# Patient Record
Sex: Female | Born: 1935 | Race: White | Hispanic: No | Smoking: Former smoker
Health system: Southern US, Community
[De-identification: ages and names within clinical notes are randomized; demographics above are authoritative.]

## PROBLEM LIST (undated history)

## (undated) DIAGNOSIS — M419 Scoliosis, unspecified: Secondary | ICD-10-CM

## (undated) DIAGNOSIS — G8929 Other chronic pain: Secondary | ICD-10-CM

## (undated) DIAGNOSIS — M549 Dorsalgia, unspecified: Secondary | ICD-10-CM

## (undated) DIAGNOSIS — M25569 Pain in unspecified knee: Secondary | ICD-10-CM

## (undated) DIAGNOSIS — M5430 Sciatica, unspecified side: Secondary | ICD-10-CM

## (undated) DIAGNOSIS — C4491 Basal cell carcinoma of skin, unspecified: Secondary | ICD-10-CM

## (undated) DIAGNOSIS — I341 Nonrheumatic mitral (valve) prolapse: Secondary | ICD-10-CM

## (undated) DIAGNOSIS — E039 Hypothyroidism, unspecified: Secondary | ICD-10-CM

## (undated) DIAGNOSIS — I10 Essential (primary) hypertension: Secondary | ICD-10-CM

## (undated) DIAGNOSIS — M5440 Lumbago with sciatica, unspecified side: Secondary | ICD-10-CM

## (undated) HISTORY — DX: Other chronic pain: G89.29

## (undated) HISTORY — PX: CATARACT EXTRACTION: SUR2

## (undated) HISTORY — DX: Essential (primary) hypertension: I10

## (undated) HISTORY — DX: Nonrheumatic mitral (valve) prolapse: I34.1

## (undated) HISTORY — DX: Scoliosis, unspecified: M41.9

## (undated) HISTORY — DX: Sciatica, unspecified side: M54.30

## (undated) HISTORY — DX: Lumbago with sciatica, unspecified side: M54.40

## (undated) HISTORY — PX: APPENDECTOMY: SHX54

## (undated) HISTORY — PX: BACK SURGERY: SHX140

## (undated) HISTORY — PX: JOINT REPLACEMENT: SHX530

## (undated) HISTORY — PX: ROTATOR CUFF REPAIR: SHX139

## (undated) HISTORY — DX: Dorsalgia, unspecified: M54.9

## (undated) HISTORY — PX: NECK SURGERY: SHX720

## (undated) HISTORY — DX: Hypothyroidism, unspecified: E03.9

## (undated) HISTORY — DX: Basal cell carcinoma of skin, unspecified: C44.91

## (undated) HISTORY — DX: Pain in unspecified knee: M25.569

---

## 2014-01-17 ENCOUNTER — Ambulatory Visit (INDEPENDENT_AMBULATORY_CARE_PROVIDER_SITE_OTHER): Payer: Medicare PPO | Admitting: Podiatry

## 2014-01-17 ENCOUNTER — Ambulatory Visit (INDEPENDENT_AMBULATORY_CARE_PROVIDER_SITE_OTHER): Payer: Medicare PPO

## 2014-01-17 ENCOUNTER — Other Ambulatory Visit: Payer: Self-pay | Admitting: *Deleted

## 2014-01-17 ENCOUNTER — Encounter: Payer: Self-pay | Admitting: Podiatry

## 2014-01-17 VITALS — BP 123/77 | HR 73 | Resp 16 | Ht 64.0 in | Wt 125.0 lb

## 2014-01-17 DIAGNOSIS — M8448XA Pathological fracture, other site, initial encounter for fracture: Secondary | ICD-10-CM

## 2014-01-17 DIAGNOSIS — M21619 Bunion of unspecified foot: Secondary | ICD-10-CM

## 2014-01-17 DIAGNOSIS — M2010 Hallux valgus (acquired), unspecified foot: Secondary | ICD-10-CM

## 2014-01-17 DIAGNOSIS — M2040 Other hammer toe(s) (acquired), unspecified foot: Secondary | ICD-10-CM

## 2014-01-17 MED ORDER — HYDROCODONE-ACETAMINOPHEN 10-325 MG PO TABS: 1.0000 | ORAL_TABLET | Freq: Three times a day (TID) | ORAL | Status: DC | PRN

## 2014-01-17 NOTE — Progress Notes (Signed)
   Subjective:    Patient ID: April Oliver, female    DOB: 1933/12/13, 80 y.o.   MRN: [id number]  HPI Comments: i have bunions on both feet, a callus on my rt foot, a hammer toe on my 2nd toe rt, and my rt heel hurts. Donnald Garre been having a lot of problems with my feet for a long time. The pain is getting worse. i dont do anything for my feet. It hurts to walk.  Foot Pain      Review of Systems     Objective:   Physical Exam        Assessment & Plan:

## 2014-01-17 NOTE — Progress Notes (Signed)
Subjective:     Patient ID: April Oliver, female   DOB: 1934-06-29, 80 y.o.   MRN: [id number]  Foot Pain   patient presents stating that she has been doing a lot of moving and that 3 days ago her heel started to hurt her intensely on the right heel. Also gives a history of bunions and hammertoe deformities  Review of Systems  All other systems reviewed and are negative.      Objective:   Physical Exam  Nursing note and vitals reviewed. Constitutional: She is oriented to person, place, and time.  Cardiovascular: Intact distal pulses.   Musculoskeletal: Normal range of motion.  Neurological: She is oriented to person, place, and time.  Skin: Skin is warm.   neurovascular status intact with severe forefoot derangement right and left foot with a overlapping second toe right over left and equinus condition diminished range of motion of the subtalar midtarsal joint and muscle strength. Patient is found to have severe discomfort not in the plantar heel but in the in tighter heel bone both medial and lateral side     Assessment:     I believe there is a stress fracture of the calcaneus and I explained this to the patient and also structural bunion and hammertoe deformity right over left    Plan:     Reviewed x-rays and did H&P with patient. And at this time placed in an air fracture walker to completely immobilize the heel and also wrote for Vicodin 05/31/2024 #30 to control the pain she is in at this time

## 2014-01-19 ENCOUNTER — Telehealth: Payer: Self-pay | Admitting: Podiatrist

## 2014-01-20 NOTE — Telephone Encounter (Signed)
Patient called the emergency pager because her boot is hurting --  She relates her foot feels too loose in the heel and she finds the boot to be cumbersome and heavy.  I explained that there is nothing I can do to adjust her boot being that it is a weekend and stated that she could call the office on Tuesday to see if they can adjust or pad the heel area for her.  She also states she has a wedding to attend in 2 weeks and she doesn't know how she is going to wear her boot with a formal outfit.

## 2014-01-21 ENCOUNTER — Ambulatory Visit: Payer: Self-pay | Admitting: Podiatry

## 2014-01-21 ENCOUNTER — Ambulatory Visit (INDEPENDENT_AMBULATORY_CARE_PROVIDER_SITE_OTHER): Payer: Medicare PPO | Admitting: Podiatry

## 2014-01-21 VITALS — BP 115/77 | HR 74 | Resp 16

## 2014-01-21 DIAGNOSIS — M8448XA Pathological fracture, other site, initial encounter for fracture: Secondary | ICD-10-CM

## 2014-01-21 DIAGNOSIS — M79609 Pain in unspecified limb: Secondary | ICD-10-CM

## 2014-01-21 NOTE — Progress Notes (Signed)
Subjective:     Patient ID: April Oliver, female   DOB: 19-Nov-1933, 80 y.o.   MRN: [id number]  HPI patient presents stating that her heel is still hurting but that she has trouble wearing the boots at all times   Review of Systems     Objective:   Physical Exam Neurovascular status intact with continued discomfort upon palpation to the medial lateral side   of the right calcaneus with a reduction of the discomfort from the intensivist of last week Assessment:     Improving from stress fracture right heel    Plan:     We dispensed an Darco shoe at this time which she may wear periodically but I still would prefer for the most part that she is in a air fracture walker

## 2014-01-28 ENCOUNTER — Telehealth: Payer: Self-pay | Admitting: *Deleted

## 2014-01-28 MED ORDER — HYDROCODONE-ACETAMINOPHEN 10-325 MG PO TABS: 1.0000 | ORAL_TABLET | Freq: Three times a day (TID) | ORAL | Status: DC | PRN

## 2014-01-28 NOTE — Telephone Encounter (Signed)
CALLED TO LET PATIENT KNOW THAT THE PRESCRIPTION WOULD BE AT THE FRONT DESK, HOWEVER HER HUSBAND COULD NOT HEAR ME ON THE PHONE, SHE WAS NOT AT HOME

## 2014-01-28 NOTE — Telephone Encounter (Signed)
Patient called and states that she still in a lot pain and also needs a refill on her prescription she only has two pills left

## 2014-01-30 ENCOUNTER — Telehealth: Payer: Self-pay | Admitting: *Deleted

## 2014-01-30 NOTE — Telephone Encounter (Signed)
Patient called and stated that her son in law had a stroke and wanted to make sure she can go and visit him.  Told patient that it was fine just said to stay off of if as much as she could

## 2014-02-07 ENCOUNTER — Ambulatory Visit: Payer: Medicare PPO | Admitting: Podiatry

## 2014-02-11 ENCOUNTER — Ambulatory Visit: Payer: Medicare PPO | Admitting: Podiatry

## 2014-02-11 ENCOUNTER — Ambulatory Visit (INDEPENDENT_AMBULATORY_CARE_PROVIDER_SITE_OTHER): Payer: Medicare PPO

## 2014-02-11 ENCOUNTER — Encounter: Payer: Self-pay | Admitting: Podiatry

## 2014-02-11 ENCOUNTER — Ambulatory Visit (INDEPENDENT_AMBULATORY_CARE_PROVIDER_SITE_OTHER): Payer: Medicare PPO | Admitting: Podiatry

## 2014-02-11 DIAGNOSIS — M8430XA Stress fracture, unspecified site, initial encounter for fracture: Secondary | ICD-10-CM

## 2014-02-11 DIAGNOSIS — M8448XA Pathological fracture, other site, initial encounter for fracture: Secondary | ICD-10-CM

## 2014-02-11 NOTE — Progress Notes (Signed)
Subjective:     Patient ID: April Oliver, female   DOB: [birth year], 80 y.o.   MRN: [id number]  HPI patient states that it feels okay and she continues to wear her boot   Review of Systems     Objective:   Physical Exam Neurovascular status unchanged with patient well oriented x3 and is found to have pain that has reduced quite dramatically in the posterior heel in the bulk of the heel bone itself    Assessment:     Improving stress fracture right    Plan:     A daily and reviewed x-rays and allow patient to gradually reduce the boot usage over the next couple weeks and begin shoe gear usage reappoint as needed

## 2014-02-26 ENCOUNTER — Telehealth: Payer: Self-pay | Admitting: *Deleted

## 2014-02-26 NOTE — Telephone Encounter (Signed)
Pt wanted to know what type of shoes dr April Oliver recommends. I told pt new balance and told her the stores were in [place] and [place]. She also said she may need another brace and i told pt no appt was needed, she can come in the office and purchase one. Pt understood.

## 2014-03-11 DIAGNOSIS — E039 Hypothyroidism, unspecified: Secondary | ICD-10-CM | POA: Insufficient documentation

## 2014-03-11 DIAGNOSIS — F419 Anxiety disorder, unspecified: Secondary | ICD-10-CM | POA: Insufficient documentation

## 2014-03-11 DIAGNOSIS — I10 Essential (primary) hypertension: Secondary | ICD-10-CM | POA: Insufficient documentation

## 2014-03-11 DIAGNOSIS — M5430 Sciatica, unspecified side: Secondary | ICD-10-CM

## 2014-03-11 HISTORY — DX: Sciatica, unspecified side: M54.30

## 2014-03-17 DIAGNOSIS — T849XXA Unspecified complication of internal orthopedic prosthetic device, implant and graft, initial encounter: Secondary | ICD-10-CM | POA: Insufficient documentation

## 2014-05-14 DIAGNOSIS — M5440 Lumbago with sciatica, unspecified side: Secondary | ICD-10-CM

## 2014-05-14 HISTORY — DX: Lumbago with sciatica, unspecified side: M54.40

## 2014-05-21 ENCOUNTER — Ambulatory Visit: Payer: Self-pay | Admitting: Pain Medicine

## 2014-05-26 ENCOUNTER — Other Ambulatory Visit: Payer: Self-pay | Admitting: Pain Medicine

## 2014-05-26 LAB — HEPATIC FUNCTION PANEL A (ARMC)
ALBUMIN: 3.8 g/dL (ref 3.4–5.0)
ALK PHOS: 59 U/L
AST: 22 U/L (ref 15–37)
BILIRUBIN DIRECT: 0.2 mg/dL (ref 0.00–0.20)
Bilirubin,Total: 0.8 mg/dL (ref 0.2–1.0)
SGPT (ALT): 25 U/L
Total Protein: 6.6 g/dL (ref 6.4–8.2)

## 2014-05-26 LAB — BASIC METABOLIC PANEL
ANION GAP: 5 — AB (ref 7–16)
BUN: 15 mg/dL (ref 7–18)
CO2: 30 mmol/L (ref 21–32)
Calcium, Total: 8.6 mg/dL (ref 8.5–10.1)
Chloride: 104 mmol/L (ref 98–107)
Creatinine: 1.07 mg/dL (ref 0.60–1.30)
GFR CALC NON AF AMER: 53 — AB
Glucose: 85 mg/dL (ref 65–99)
OSMOLALITY: 278 (ref 275–301)
Potassium: 3.8 mmol/L (ref 3.5–5.1)
Sodium: 139 mmol/L (ref 136–145)

## 2014-05-26 LAB — PROTIME-INR
INR: 1
Prothrombin Time: 13.1 secs (ref 11.5–14.7)

## 2014-05-26 LAB — SEDIMENTATION RATE: Erythrocyte Sed Rate: 4 mm/hr (ref 0–30)

## 2014-05-26 LAB — MAGNESIUM: Magnesium: 1.5 mg/dL — ABNORMAL LOW

## 2014-05-27 ENCOUNTER — Emergency Department: Payer: Self-pay | Admitting: Internal Medicine

## 2014-06-06 ENCOUNTER — Ambulatory Visit (INDEPENDENT_AMBULATORY_CARE_PROVIDER_SITE_OTHER): Payer: Medicare PPO | Admitting: Podiatry

## 2014-06-06 ENCOUNTER — Ambulatory Visit (INDEPENDENT_AMBULATORY_CARE_PROVIDER_SITE_OTHER): Payer: Medicare PPO

## 2014-06-06 VITALS — BP 149/86 | HR 75 | Resp 16

## 2014-06-06 DIAGNOSIS — M722 Plantar fascial fibromatosis: Secondary | ICD-10-CM

## 2014-06-06 DIAGNOSIS — M2041 Other hammer toe(s) (acquired), right foot: Secondary | ICD-10-CM

## 2014-06-06 MED ORDER — TRIAMCINOLONE ACETONIDE 10 MG/ML IJ SUSP: 10.0000 mg | Freq: Once | INTRAMUSCULAR | Status: AC
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
## 2014-06-10 DIAGNOSIS — M25369 Other instability, unspecified knee: Secondary | ICD-10-CM | POA: Insufficient documentation

## 2014-06-10 DIAGNOSIS — M25569 Pain in unspecified knee: Secondary | ICD-10-CM

## 2014-06-10 DIAGNOSIS — Z9889 Other specified postprocedural states: Secondary | ICD-10-CM | POA: Insufficient documentation

## 2014-06-10 HISTORY — DX: Pain in unspecified knee: M25.569

## 2014-06-23 DIAGNOSIS — G8929 Other chronic pain: Secondary | ICD-10-CM | POA: Insufficient documentation

## 2014-06-23 DIAGNOSIS — M549 Dorsalgia, unspecified: Secondary | ICD-10-CM

## 2014-07-01 ENCOUNTER — Ambulatory Visit: Payer: Self-pay | Admitting: Podiatry

## 2014-07-01 ENCOUNTER — Ambulatory Visit: Payer: Medicare PPO | Admitting: Podiatry

## 2014-07-11 ENCOUNTER — Ambulatory Visit (INDEPENDENT_AMBULATORY_CARE_PROVIDER_SITE_OTHER): Payer: Medicare PPO | Admitting: Podiatry

## 2014-07-11 VITALS — BP 133/86 | HR 67 | Resp 16

## 2014-07-11 DIAGNOSIS — M722 Plantar fascial fibromatosis: Secondary | ICD-10-CM

## 2014-07-11 MED ORDER — TRIAMCINOLONE ACETONIDE 10 MG/ML IJ SUSP
10.0000 mg | Freq: Once | INTRAMUSCULAR | Status: AC
  Administered 2014-07-11: 10 mg
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
## 2014-07-31 DIAGNOSIS — R768 Other specified abnormal immunological findings in serum: Secondary | ICD-10-CM | POA: Insufficient documentation

## 2014-08-01 ENCOUNTER — Emergency Department: Payer: Self-pay | Admitting: Emergency Medicine

## 2014-08-01 LAB — CBC
HCT: 30.6 % — AB (ref 35.0–47.0)
HGB: 10.4 g/dL — ABNORMAL LOW (ref 12.0–16.0)
MCH: 33 pg (ref 26.0–34.0)
MCHC: 34 g/dL (ref 32.0–36.0)
MCV: 97 fL (ref 80–100)
Platelet: 269 10*3/uL (ref 150–440)
RBC: 3.16 10*6/uL — AB (ref 3.80–5.20)
RDW: 13.4 % (ref 11.5–14.5)
WBC: 5.7 10*3/uL (ref 3.6–11.0)

## 2014-08-01 LAB — COMPREHENSIVE METABOLIC PANEL
ANION GAP: 6 — AB (ref 7–16)
AST: 22 U/L (ref 15–37)
Albumin: 3.2 g/dL — ABNORMAL LOW (ref 3.4–5.0)
Alkaline Phosphatase: 55 U/L
BUN: 8 mg/dL (ref 7–18)
Bilirubin,Total: 0.6 mg/dL (ref 0.2–1.0)
Calcium, Total: 8.3 mg/dL — ABNORMAL LOW (ref 8.5–10.1)
Chloride: 100 mmol/L (ref 98–107)
Co2: 29 mmol/L (ref 21–32)
Creatinine: 0.89 mg/dL (ref 0.60–1.30)
GLUCOSE: 78 mg/dL (ref 65–99)
OSMOLALITY: 267 (ref 275–301)
POTASSIUM: 3.8 mmol/L (ref 3.5–5.1)
SGPT (ALT): 26 U/L
Sodium: 135 mmol/L — ABNORMAL LOW (ref 136–145)
Total Protein: 5.7 g/dL — ABNORMAL LOW (ref 6.4–8.2)

## 2014-08-05 DIAGNOSIS — Z9660 Presence of unspecified orthopedic joint implant: Secondary | ICD-10-CM | POA: Insufficient documentation

## 2014-08-06 LAB — CULTURE, BLOOD (SINGLE)

## 2014-08-08 ENCOUNTER — Ambulatory Visit: Payer: Medicare PPO | Admitting: Podiatry

## 2014-09-03 DIAGNOSIS — M6281 Muscle weakness (generalized): Secondary | ICD-10-CM | POA: Diagnosis not present

## 2014-09-05 DIAGNOSIS — M6281 Muscle weakness (generalized): Secondary | ICD-10-CM | POA: Diagnosis not present

## 2014-09-08 DIAGNOSIS — M6281 Muscle weakness (generalized): Secondary | ICD-10-CM | POA: Diagnosis not present

## 2014-09-11 ENCOUNTER — Telehealth: Payer: Self-pay | Admitting: *Deleted
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
## 2014-09-22 DIAGNOSIS — Z1239 Encounter for other screening for malignant neoplasm of breast: Secondary | ICD-10-CM | POA: Diagnosis not present

## 2014-09-22 DIAGNOSIS — M8580 Other specified disorders of bone density and structure, unspecified site: Secondary | ICD-10-CM | POA: Diagnosis not present

## 2014-09-22 DIAGNOSIS — M25562 Pain in left knee: Secondary | ICD-10-CM | POA: Diagnosis not present

## 2014-09-23 DIAGNOSIS — M6281 Muscle weakness (generalized): Secondary | ICD-10-CM | POA: Diagnosis not present

## 2014-09-24 DIAGNOSIS — M6281 Muscle weakness (generalized): Secondary | ICD-10-CM | POA: Diagnosis not present

## 2014-10-07 DIAGNOSIS — Z96652 Presence of left artificial knee joint: Secondary | ICD-10-CM | POA: Diagnosis not present

## 2014-10-07 DIAGNOSIS — G8929 Other chronic pain: Secondary | ICD-10-CM | POA: Diagnosis not present

## 2014-10-07 DIAGNOSIS — M25562 Pain in left knee: Secondary | ICD-10-CM | POA: Diagnosis not present

## 2014-10-24 ENCOUNTER — Encounter: Payer: Self-pay | Admitting: Podiatry

## 2014-10-24 ENCOUNTER — Ambulatory Visit (INDEPENDENT_AMBULATORY_CARE_PROVIDER_SITE_OTHER): Payer: Commercial Managed Care - HMO | Admitting: Podiatry

## 2014-10-24 VITALS — BP 133/83 | HR 79 | Resp 16

## 2014-10-24 DIAGNOSIS — M779 Enthesopathy, unspecified: Secondary | ICD-10-CM | POA: Diagnosis not present

## 2014-10-24 MED ORDER — TRIAMCINOLONE ACETONIDE 10 MG/ML IJ SUSP
10.0000 mg | Freq: Once | INTRAMUSCULAR | Status: AC
  Administered 2014-10-24: 10 mg
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
## 2014-10-27 DIAGNOSIS — M542 Cervicalgia: Secondary | ICD-10-CM | POA: Diagnosis not present

## 2014-10-27 DIAGNOSIS — E039 Hypothyroidism, unspecified: Secondary | ICD-10-CM | POA: Diagnosis not present

## 2014-10-27 DIAGNOSIS — I10 Essential (primary) hypertension: Secondary | ICD-10-CM | POA: Diagnosis not present

## 2014-10-27 DIAGNOSIS — J45909 Unspecified asthma, uncomplicated: Secondary | ICD-10-CM | POA: Insufficient documentation

## 2014-10-29 ENCOUNTER — Ambulatory Visit: Payer: Self-pay | Admitting: Internal Medicine

## 2014-10-29 DIAGNOSIS — Z1231 Encounter for screening mammogram for malignant neoplasm of breast: Secondary | ICD-10-CM | POA: Diagnosis not present

## 2014-11-03 ENCOUNTER — Ambulatory Visit: Payer: Self-pay | Admitting: Pain Medicine

## 2014-11-03 DIAGNOSIS — Z96653 Presence of artificial knee joint, bilateral: Secondary | ICD-10-CM | POA: Diagnosis not present

## 2014-11-03 DIAGNOSIS — M25561 Pain in right knee: Secondary | ICD-10-CM | POA: Diagnosis not present

## 2014-11-03 DIAGNOSIS — M47812 Spondylosis without myelopathy or radiculopathy, cervical region: Secondary | ICD-10-CM | POA: Diagnosis not present

## 2014-11-03 DIAGNOSIS — M25562 Pain in left knee: Secondary | ICD-10-CM | POA: Diagnosis not present

## 2014-11-03 DIAGNOSIS — M170 Bilateral primary osteoarthritis of knee: Secondary | ICD-10-CM | POA: Diagnosis not present

## 2014-11-03 DIAGNOSIS — F17200 Nicotine dependence, unspecified, uncomplicated: Secondary | ICD-10-CM | POA: Diagnosis not present

## 2014-11-03 DIAGNOSIS — M47817 Spondylosis without myelopathy or radiculopathy, lumbosacral region: Secondary | ICD-10-CM | POA: Diagnosis not present

## 2014-11-03 DIAGNOSIS — M419 Scoliosis, unspecified: Secondary | ICD-10-CM | POA: Diagnosis not present

## 2014-11-03 DIAGNOSIS — M542 Cervicalgia: Secondary | ICD-10-CM | POA: Diagnosis not present

## 2014-11-03 DIAGNOSIS — E039 Hypothyroidism, unspecified: Secondary | ICD-10-CM | POA: Diagnosis not present

## 2014-11-03 DIAGNOSIS — M546 Pain in thoracic spine: Secondary | ICD-10-CM | POA: Diagnosis not present

## 2014-11-03 DIAGNOSIS — G894 Chronic pain syndrome: Secondary | ICD-10-CM | POA: Diagnosis not present

## 2014-11-03 DIAGNOSIS — Z79899 Other long term (current) drug therapy: Secondary | ICD-10-CM | POA: Diagnosis not present

## 2014-11-03 DIAGNOSIS — M461 Sacroiliitis, not elsewhere classified: Secondary | ICD-10-CM | POA: Diagnosis not present

## 2014-11-03 DIAGNOSIS — I10 Essential (primary) hypertension: Secondary | ICD-10-CM | POA: Diagnosis not present

## 2014-11-24 ENCOUNTER — Ambulatory Visit: Payer: Self-pay | Admitting: Pain Medicine

## 2014-11-24 DIAGNOSIS — N183 Chronic kidney disease, stage 3 (moderate): Secondary | ICD-10-CM | POA: Diagnosis not present

## 2014-11-24 DIAGNOSIS — Z01818 Encounter for other preprocedural examination: Secondary | ICD-10-CM | POA: Diagnosis not present

## 2014-11-24 DIAGNOSIS — M545 Low back pain: Secondary | ICD-10-CM | POA: Diagnosis not present

## 2014-11-24 DIAGNOSIS — S3992XA Unspecified injury of lower back, initial encounter: Secondary | ICD-10-CM | POA: Diagnosis not present

## 2014-11-24 DIAGNOSIS — M47896 Other spondylosis, lumbar region: Secondary | ICD-10-CM | POA: Diagnosis not present

## 2014-11-24 DIAGNOSIS — M5134 Other intervertebral disc degeneration, thoracic region: Secondary | ICD-10-CM | POA: Diagnosis not present

## 2014-11-24 DIAGNOSIS — E039 Hypothyroidism, unspecified: Secondary | ICD-10-CM | POA: Diagnosis not present

## 2014-11-24 DIAGNOSIS — R300 Dysuria: Secondary | ICD-10-CM | POA: Diagnosis not present

## 2014-11-24 DIAGNOSIS — M2578 Osteophyte, vertebrae: Secondary | ICD-10-CM | POA: Diagnosis not present

## 2014-11-24 DIAGNOSIS — N390 Urinary tract infection, site not specified: Secondary | ICD-10-CM | POA: Diagnosis not present

## 2014-11-24 DIAGNOSIS — Z981 Arthrodesis status: Secondary | ICD-10-CM | POA: Diagnosis not present

## 2014-11-24 DIAGNOSIS — M47894 Other spondylosis, thoracic region: Secondary | ICD-10-CM | POA: Diagnosis not present

## 2014-11-24 DIAGNOSIS — I10 Essential (primary) hypertension: Secondary | ICD-10-CM | POA: Diagnosis not present

## 2014-12-01 ENCOUNTER — Ambulatory Visit
Admit: 2014-12-01 | Disposition: A | Discharge status: Home / Self Care | Payer: Self-pay | Attending: Pain Medicine | Admitting: Pain Medicine

## 2014-12-01 DIAGNOSIS — M25562 Pain in left knee: Secondary | ICD-10-CM | POA: Diagnosis not present

## 2014-12-01 DIAGNOSIS — Z96652 Presence of left artificial knee joint: Secondary | ICD-10-CM | POA: Diagnosis not present

## 2014-12-01 DIAGNOSIS — M170 Bilateral primary osteoarthritis of knee: Secondary | ICD-10-CM | POA: Diagnosis not present

## 2014-12-08 DIAGNOSIS — M8580 Other specified disorders of bone density and structure, unspecified site: Secondary | ICD-10-CM | POA: Diagnosis not present

## 2014-12-12 DIAGNOSIS — F419 Anxiety disorder, unspecified: Secondary | ICD-10-CM | POA: Diagnosis not present

## 2014-12-12 DIAGNOSIS — M810 Age-related osteoporosis without current pathological fracture: Secondary | ICD-10-CM | POA: Diagnosis not present

## 2014-12-15 ENCOUNTER — Ambulatory Visit
Admit: 2014-12-15 | Disposition: A | Discharge status: Home / Self Care | Payer: Self-pay | Attending: Pain Medicine | Admitting: Pain Medicine

## 2014-12-15 DIAGNOSIS — Z9889 Other specified postprocedural states: Secondary | ICD-10-CM | POA: Diagnosis not present

## 2014-12-15 DIAGNOSIS — M461 Sacroiliitis, not elsewhere classified: Secondary | ICD-10-CM | POA: Diagnosis not present

## 2014-12-15 DIAGNOSIS — M170 Bilateral primary osteoarthritis of knee: Secondary | ICD-10-CM | POA: Diagnosis not present

## 2014-12-15 DIAGNOSIS — M545 Low back pain: Secondary | ICD-10-CM | POA: Diagnosis not present

## 2014-12-15 DIAGNOSIS — M47817 Spondylosis without myelopathy or radiculopathy, lumbosacral region: Secondary | ICD-10-CM | POA: Diagnosis not present

## 2014-12-15 DIAGNOSIS — M25562 Pain in left knee: Secondary | ICD-10-CM | POA: Diagnosis not present

## 2014-12-15 DIAGNOSIS — M5416 Radiculopathy, lumbar region: Secondary | ICD-10-CM | POA: Diagnosis not present

## 2014-12-15 DIAGNOSIS — Z96652 Presence of left artificial knee joint: Secondary | ICD-10-CM | POA: Diagnosis not present

## 2014-12-24 DIAGNOSIS — M549 Dorsalgia, unspecified: Secondary | ICD-10-CM | POA: Diagnosis not present

## 2014-12-24 DIAGNOSIS — Z79891 Long term (current) use of opiate analgesic: Secondary | ICD-10-CM | POA: Diagnosis not present

## 2014-12-24 DIAGNOSIS — M79606 Pain in leg, unspecified: Secondary | ICD-10-CM | POA: Diagnosis not present

## 2014-12-24 DIAGNOSIS — M542 Cervicalgia: Secondary | ICD-10-CM | POA: Diagnosis not present

## 2014-12-24 DIAGNOSIS — G8929 Other chronic pain: Secondary | ICD-10-CM | POA: Diagnosis not present

## 2015-01-13 DIAGNOSIS — F4542 Pain disorder with related psychological factors: Secondary | ICD-10-CM | POA: Diagnosis not present

## 2015-01-15 DIAGNOSIS — M549 Dorsalgia, unspecified: Secondary | ICD-10-CM | POA: Diagnosis not present

## 2015-01-15 DIAGNOSIS — M25569 Pain in unspecified knee: Secondary | ICD-10-CM | POA: Diagnosis not present

## 2015-01-15 DIAGNOSIS — M5136 Other intervertebral disc degeneration, lumbar region: Secondary | ICD-10-CM | POA: Diagnosis not present

## 2015-01-15 DIAGNOSIS — M5416 Radiculopathy, lumbar region: Secondary | ICD-10-CM | POA: Diagnosis not present

## 2015-01-15 DIAGNOSIS — G8929 Other chronic pain: Secondary | ICD-10-CM | POA: Diagnosis not present

## 2015-01-15 DIAGNOSIS — M47816 Spondylosis without myelopathy or radiculopathy, lumbar region: Secondary | ICD-10-CM | POA: Diagnosis not present

## 2015-01-15 DIAGNOSIS — M179 Osteoarthritis of knee, unspecified: Secondary | ICD-10-CM | POA: Diagnosis not present

## 2015-01-15 DIAGNOSIS — M542 Cervicalgia: Secondary | ICD-10-CM | POA: Diagnosis not present

## 2015-01-29 DIAGNOSIS — M25569 Pain in unspecified knee: Secondary | ICD-10-CM | POA: Diagnosis not present

## 2015-01-29 DIAGNOSIS — M47816 Spondylosis without myelopathy or radiculopathy, lumbar region: Secondary | ICD-10-CM | POA: Diagnosis not present

## 2015-01-29 DIAGNOSIS — M5136 Other intervertebral disc degeneration, lumbar region: Secondary | ICD-10-CM | POA: Diagnosis not present

## 2015-01-29 DIAGNOSIS — M542 Cervicalgia: Secondary | ICD-10-CM | POA: Diagnosis not present

## 2015-01-29 DIAGNOSIS — M5416 Radiculopathy, lumbar region: Secondary | ICD-10-CM | POA: Diagnosis not present

## 2015-01-29 DIAGNOSIS — M549 Dorsalgia, unspecified: Secondary | ICD-10-CM | POA: Diagnosis not present

## 2015-01-29 DIAGNOSIS — M179 Osteoarthritis of knee, unspecified: Secondary | ICD-10-CM | POA: Diagnosis not present

## 2015-01-29 DIAGNOSIS — G8929 Other chronic pain: Secondary | ICD-10-CM | POA: Diagnosis not present

## 2015-02-24 DIAGNOSIS — M488X6 Other specified spondylopathies, lumbar region: Secondary | ICD-10-CM | POA: Diagnosis not present

## 2015-02-24 DIAGNOSIS — M47816 Spondylosis without myelopathy or radiculopathy, lumbar region: Secondary | ICD-10-CM | POA: Diagnosis not present

## 2015-02-24 DIAGNOSIS — M5387 Other specified dorsopathies, lumbosacral region: Secondary | ICD-10-CM | POA: Diagnosis not present

## 2015-02-24 DIAGNOSIS — M545 Low back pain: Secondary | ICD-10-CM | POA: Diagnosis not present

## 2015-03-10 DIAGNOSIS — F419 Anxiety disorder, unspecified: Secondary | ICD-10-CM | POA: Diagnosis not present

## 2015-03-10 DIAGNOSIS — M5442 Lumbago with sciatica, left side: Secondary | ICD-10-CM | POA: Diagnosis not present

## 2015-03-10 DIAGNOSIS — G8929 Other chronic pain: Secondary | ICD-10-CM | POA: Diagnosis not present

## 2015-03-10 DIAGNOSIS — M25561 Pain in right knee: Secondary | ICD-10-CM | POA: Diagnosis not present

## 2015-03-10 DIAGNOSIS — M1712 Unilateral primary osteoarthritis, left knee: Secondary | ICD-10-CM | POA: Diagnosis not present

## 2015-03-10 DIAGNOSIS — M25562 Pain in left knee: Secondary | ICD-10-CM | POA: Diagnosis not present

## 2015-03-10 DIAGNOSIS — Z96652 Presence of left artificial knee joint: Secondary | ICD-10-CM | POA: Diagnosis not present

## 2015-03-12 DIAGNOSIS — M25569 Pain in unspecified knee: Secondary | ICD-10-CM | POA: Diagnosis not present

## 2015-03-12 DIAGNOSIS — F1120 Opioid dependence, uncomplicated: Secondary | ICD-10-CM | POA: Diagnosis not present

## 2015-03-12 DIAGNOSIS — G8929 Other chronic pain: Secondary | ICD-10-CM | POA: Diagnosis not present

## 2015-03-12 DIAGNOSIS — M5387 Other specified dorsopathies, lumbosacral region: Secondary | ICD-10-CM | POA: Diagnosis not present

## 2015-03-12 DIAGNOSIS — M545 Low back pain: Secondary | ICD-10-CM | POA: Diagnosis not present

## 2015-03-12 DIAGNOSIS — M549 Dorsalgia, unspecified: Secondary | ICD-10-CM | POA: Diagnosis not present

## 2015-03-12 DIAGNOSIS — Z79891 Long term (current) use of opiate analgesic: Secondary | ICD-10-CM | POA: Diagnosis not present

## 2015-03-12 DIAGNOSIS — M488X6 Other specified spondylopathies, lumbar region: Secondary | ICD-10-CM | POA: Diagnosis not present

## 2015-03-12 DIAGNOSIS — M47816 Spondylosis without myelopathy or radiculopathy, lumbar region: Secondary | ICD-10-CM | POA: Diagnosis not present

## 2015-03-19 DIAGNOSIS — F329 Major depressive disorder, single episode, unspecified: Secondary | ICD-10-CM | POA: Diagnosis not present

## 2015-03-19 DIAGNOSIS — M810 Age-related osteoporosis without current pathological fracture: Secondary | ICD-10-CM | POA: Diagnosis not present

## 2015-03-19 DIAGNOSIS — E039 Hypothyroidism, unspecified: Secondary | ICD-10-CM | POA: Diagnosis not present

## 2015-03-21 DIAGNOSIS — M810 Age-related osteoporosis without current pathological fracture: Secondary | ICD-10-CM | POA: Insufficient documentation

## 2015-03-21 DIAGNOSIS — F32A Depression, unspecified: Secondary | ICD-10-CM | POA: Insufficient documentation

## 2015-03-21 DIAGNOSIS — F329 Major depressive disorder, single episode, unspecified: Secondary | ICD-10-CM | POA: Insufficient documentation

## 2015-03-30 DIAGNOSIS — M25561 Pain in right knee: Secondary | ICD-10-CM | POA: Diagnosis not present

## 2015-03-30 DIAGNOSIS — G894 Chronic pain syndrome: Secondary | ICD-10-CM | POA: Insufficient documentation

## 2015-04-08 ENCOUNTER — Telehealth: Payer: Self-pay | Admitting: *Deleted

## 2015-04-08 NOTE — Telephone Encounter (Signed)
Entered in error

## 2015-04-08 NOTE — Telephone Encounter (Signed)
Pt asked what athletic shoe would be good for her plantar fasciitis, and hammer toes and bunions.  I told pt generally the [employer] Balance higher numbers were good shoes for OTC shoes with wide and deep toe boxes for forefoot deformities and a well place arch cookie for some arch support.  Pt states she also has knee pain; I encouraged pt to make an appt for prescriptive orthotics to move help with her arches and knees.  Pt agreed and was transferred to schedulers.

## 2015-04-09 DIAGNOSIS — R300 Dysuria: Secondary | ICD-10-CM | POA: Diagnosis not present

## 2015-04-09 DIAGNOSIS — M722 Plantar fascial fibromatosis: Secondary | ICD-10-CM | POA: Diagnosis not present

## 2015-04-16 ENCOUNTER — Ambulatory Visit: Payer: Commercial Managed Care - HMO | Admitting: Podiatry

## 2015-04-16 ENCOUNTER — Ambulatory Visit (INDEPENDENT_AMBULATORY_CARE_PROVIDER_SITE_OTHER): Payer: Commercial Managed Care - HMO | Admitting: Podiatry

## 2015-04-16 ENCOUNTER — Encounter: Payer: Self-pay | Admitting: Podiatry

## 2015-04-16 ENCOUNTER — Ambulatory Visit (INDEPENDENT_AMBULATORY_CARE_PROVIDER_SITE_OTHER): Payer: Commercial Managed Care - HMO

## 2015-04-16 VITALS — BP 66/44 | HR 55 | Resp 18

## 2015-04-16 DIAGNOSIS — M2010 Hallux valgus (acquired), unspecified foot: Secondary | ICD-10-CM

## 2015-04-16 DIAGNOSIS — B351 Tinea unguium: Secondary | ICD-10-CM | POA: Diagnosis not present

## 2015-04-16 DIAGNOSIS — M722 Plantar fascial fibromatosis: Secondary | ICD-10-CM

## 2015-04-16 DIAGNOSIS — M779 Enthesopathy, unspecified: Secondary | ICD-10-CM | POA: Insufficient documentation

## 2015-04-16 DIAGNOSIS — M2040 Other hammer toe(s) (acquired), unspecified foot: Secondary | ICD-10-CM | POA: Diagnosis not present

## 2015-04-16 NOTE — Patient Instructions (Signed)
Peroneal Tendinitis with Rehab Tendonitis is inflammation of a tendon. Inflammation of the tendons on the back of the outer ankle (peroneal tendons) is known as peroneal tendonitis. The peroneal tendons are responsible for connecting the muscles that allow you to stand on your tiptoes to the bones of the ankle. For this reason, peroneal tendonitis often causes pain when trying to complete such motions. Peroneal tendonitis often involves a tear (strain) of the peroneal tendons. Strains are classified into three categories. Grade 1 strains cause pain, but the tendon is not lengthened. Grade 2 strains include a lengthened ligament, due to the ligament being stretched or partially ruptured. With grade 2 strains there is still function, although function may be decreased. Grade 3 strains involve a complete tear of the tendon or muscle, and function is usually impaired. SYMPTOMS   Pain, tenderness, swelling, warmth, or redness over the back of the outer side of the ankle, the outer part of the mid-foot, or the bottom of the arch.  Pain that gets worse with ankle motion (especially when pushing off or pushing down with the front of the foot), or when standing on the ball of the foot or pushing the foot outward.  Crackling sound (crepitation) when the tendon is moved or touched. CAUSES  Peroneal tendinitis occurs when injury to the peroneal tendons causes the body to respond with inflammation. Common causes of injury include:  An overuse injury, in which the groove behind the outer ankle (where the tendon is located) causes wear on the tendon.  A sudden stress placed on the tendon, such as from an increase in the intensity, frequency, or duration of training.  Direct hit (trauma) to the tendon.  Return to activity too soon after a previous ankle injury. RISK INCREASES WITH:  Sports that require sudden, repetitive pushing off of the foot, such as jumping or quick starts.  Kicking and running sports,  especially running down hills or long distances.  Poor strength and flexibility.  Previous injury to the foot, ankle, or leg. PREVENTION  Warm up and stretch properly before activity.  Allow for adequate recovery between workouts.  Maintain physical fitness:  Strength, flexibility, and endurance.  Cardiovascular fitness.  Complete rehabilitation after previous injury. PROGNOSIS  If treated properly, peroneal tendonitis usually heals within 6 weeks.  RELATED COMPLICATIONS  Longer healing time, if not properly treated or if not given enough time to heal.  Recurring symptoms if activity is resumed too soon, with overuse, or when using poor technique.  If untreated, tendinitis may result in tendon rupture, requiring surgery. TREATMENT  Treatment first involves the use of ice and medicine to reduce pain and inflammation. The use of strengthening and stretching exercises may help reduce pain with activity. These exercises may be performed at home or with a therapist. Sometimes, the foot and ankle will be restrained for 10 to 14 days to promote healing. Your caregiver may advise that you place a heel lift in your shoes to reduce the stress placed on the tendon. If nonsurgical treatment is unsuccessful, surgery to remove the inflamed tendon lining (sheath) may be advised.  MEDICATION   If pain medicine is needed, nonsteroidal anti-inflammatory medicines (aspirin and ibuprofen), or other minor pain relievers (acetaminophen), are often advised.  Do not take pain medicine for 7 days before surgery.  Prescription pain relievers may be given, if your caregiver thinks they are needed. Use only as directed and only as much as you need. HEAT AND COLD  Cold treatment (icing) should   be applied for 10 to 15 minutes every 2 to 3 hours for inflammation and pain, and immediately after activity that aggravates your symptoms. Use ice packs or an ice massage.  Heat treatment may be used before  performing stretching and strengthening activities prescribed by your caregiver, physical therapist, or athletic trainer. Use a heat pack or a warm water soak. SEEK MEDICAL CARE IF:  Symptoms get worse or do not improve in 2 to 4 weeks, despite treatment.  New, unexplained symptoms develop. (Drugs used in treatment may produce side effects.) EXERCISES RANGE OF MOTION (ROM) AND STRETCHING EXERCISES - Peroneal Tendinitis These exercises may help you when beginning to rehabilitate your injury. Your symptoms may resolve with or without further involvement from your physician, physical therapist or athletic trainer. While completing these exercises, remember:   Restoring tissue flexibility helps normal motion to return to the joints. This allows healthier, less painful movement and activity.  An effective stretch should be held for at least 30 seconds.  A stretch should never be painful. You should only feel a gentle lengthening or release in the stretched tissue. RANGE OF MOTION - Ankle Eversion  Sit with your right / left ankle crossed over your opposite knee.  Grip your foot with your opposite hand, placing your thumb on the top of your foot and your fingers across the bottom of your foot.  Gently push your foot downward with a slight rotation, so your littlest toes rise slightly toward the ceiling.  You should feel a gentle stretch on the inside of your ankle. Hold the stretch for __________ seconds. Repeat __________ times. Complete this exercise __________ times per day.  RANGE OF MOTION - Ankle Inversion  Sit with your right / left ankle crossed over your opposite knee.  Grip your foot with your opposite hand, placing your thumb on the bottom of your foot and your fingers across the top of your foot.  Gently pull your foot so the smallest toe comes toward you and your thumb pushes the inside of the ball of your foot away from you.  You should feel a gentle stretch on the outside of  your ankle. Hold the stretch for __________ seconds. Repeat __________ times. Complete this exercise __________ times per day.  RANGE OF MOTION - Ankle Plantar Flexion  Sit with your right / left leg crossed over your opposite knee.  Use your opposite hand to pull the top of your foot and toes toward you.  You should feel a gentle stretch on the top of your foot and ankle. Hold this position for __________ seconds. Repeat __________ times. Complete __________ times per day.  STRETCH - Gastroc, Standing  Place your hands on a wall.  Extend your right / left leg behind you, keeping the front knee somewhat bent.  Slightly point your toes inward on your back foot.  Keeping your right / left heel on the floor and your knee straight, shift your weight toward the wall, not allowing your back to arch.  You should feel a gentle stretch in the calf. Hold this position for __________ seconds. Repeat __________ times. Complete this stretch __________ times per day. STRETCH - Soleus, Standing  Place your hands on a wall.  Extend your right / left leg behind you, keeping the other knee somewhat bent.  Slightly point your toes inward on your back foot.  Keep your heel on the floor, bend your back knee, and slightly shift your weight over the back leg so that   you feel a gentle stretch deep in your back calf.  Hold this position for __________ seconds. Repeat __________ times. Complete this stretch __________ times per day. STRETCH - Gastrocsoleus, Standing Note: This exercise can place a lot of stress on your foot and ankle. Please complete this exercise only if specifically instructed by your caregiver.   Place the ball of your right / left foot on a step, keeping your other foot firmly on the same step.  Hold on to the wall or a rail for balance.  Slowly lift your other foot, allowing your body weight to press your heel down over the edge of the step.  You should feel a stretch in your  right / left calf.  Hold this position for __________ seconds.  Repeat this exercise with a slight bend in your knee. Repeat __________ times. Complete this stretch __________ times per day.  STRENGTHENING EXERCISES - Peroneal Tendinitis  These exercises may help you when beginning to rehabilitate your injury. They may resolve your symptoms with or without further involvement from your physician, physical therapist or athletic trainer. While completing these exercises, remember:   Muscles can gain both the endurance and the strength needed for everyday activities through controlled exercises.  Complete these exercises as instructed by your physician, physical therapist or athletic trainer. Increase the resistance and repetitions only as guided by your caregiver. STRENGTH - Dorsiflexors  Secure a rubber exercise band or tubing to a fixed object (table, pole) and loop the other end around your right / left foot.  Sit on the floor facing the fixed object. The band should be slightly tense when your foot is relaxed.  Slowly draw your foot back toward you, using your ankle and toes.  Hold this position for __________ seconds. Slowly release the tension in the band and return your foot to the starting position. Repeat __________ times. Complete this exercise __________ times per day.  STRENGTH - Towel Curls  Sit in a chair, on a non-carpeted surface.  Place your foot on a towel, keeping your heel on the floor.  Pull the towel toward your heel only by curling your toes. Keep your heel on the floor.  If instructed by your physician, physical therapist or athletic trainer, add weight to the end of the towel. Repeat __________ times. Complete this exercise __________ times per day. STRENGTH - Ankle Eversion   Secure one end of a rubber exercise band or tubing to a fixed object (table, pole). Loop the other end around your foot, just before your toes.  Place your fists between your knees.  This will focus your strengthening at your ankle.  Drawing the band across your opposite foot, away from the pole, slowly, pull your little toe out and up. Make sure the band is positioned to resist the entire motion.  Hold this position for __________ seconds.  Have your muscles resist the band, as it slowly pulls your foot back to the starting position. Repeat __________ times. Complete this exercise __________ times per day.  Document Released: 08/15/2005 Document Revised: 12/30/2013 Document Reviewed: 11/27/2008 [facility] Patient Information 2015 [place], [place]. This information is not intended to replace advice given to you by your health care provider. Make sure you discuss any questions you have with your health care provider. Plantar Fasciitis (Heel Spur Syndrome) with Rehab The plantar fascia is a fibrous, ligament-like, soft-tissue structure that spans the bottom of the foot. Plantar fasciitis is a condition that causes pain in the foot due to inflammation  of the tissue. SYMPTOMS   Pain and tenderness on the underneath side of the foot.  Pain that worsens with standing or walking. CAUSES  Plantar fasciitis is caused by irritation and injury to the plantar fascia on the underneath side of the foot. Common mechanisms of injury include:  Direct trauma to bottom of the foot.  Damage to a small nerve that runs under the foot where the main fascia attaches to the heel bone.  Stress placed on the plantar fascia due to bone spurs. RISK INCREASES WITH:   Activities that place stress on the plantar fascia (running, jumping, pivoting, or cutting).  Poor strength and flexibility.  Improperly fitted shoes.  Tight calf muscles.  Flat feet.  Failure to warm-up properly before activity.  Obesity. PREVENTION  Warm up and stretch properly before activity.  Allow for adequate recovery between workouts.  Maintain physical fitness:  Strength, flexibility, and  endurance.  Cardiovascular fitness.  Maintain a health body weight.  Avoid stress on the plantar fascia.  Wear properly fitted shoes, including arch supports for individuals who have flat feet. PROGNOSIS  If treated properly, then the symptoms of plantar fasciitis usually resolve without surgery. However, occasionally surgery is necessary. RELATED COMPLICATIONS   Recurrent symptoms that may result in a chronic condition.  Problems of the lower back that are caused by compensating for the injury, such as limping.  Pain or weakness of the foot during push-off following surgery.  Chronic inflammation, scarring, and partial or complete fascia tear, occurring more often from repeated injections. TREATMENT  Treatment initially involves the use of ice and medication to help reduce pain and inflammation. The use of strengthening and stretching exercises may help reduce pain with activity, especially stretches of the Achilles tendon. These exercises may be performed at home or with a therapist. Your caregiver may recommend that you use heel cups of arch supports to help reduce stress on the plantar fascia. Occasionally, corticosteroid injections are given to reduce inflammation. If symptoms persist for greater than 6 months despite non-surgical (conservative), then surgery may be recommended.  MEDICATION   If pain medication is necessary, then nonsteroidal anti-inflammatory medications, such as aspirin and ibuprofen, or other minor pain relievers, such as acetaminophen, are often recommended.  Do not take pain medication within 7 days before surgery.  Prescription pain relievers may be given if deemed necessary by your caregiver. Use only as directed and only as much as you need.  Corticosteroid injections may be given by your caregiver. These injections should be reserved for the most serious cases, because they may only be given a certain number of times. HEAT AND COLD  Cold treatment  (icing) relieves pain and reduces inflammation. Cold treatment should be applied for 10 to 15 minutes every 2 to 3 hours for inflammation and pain and immediately after any activity that aggravates your symptoms. Use ice packs or massage the area with a piece of ice (ice massage).  Heat treatment may be used prior to performing the stretching and strengthening activities prescribed by your caregiver, physical therapist, or athletic trainer. Use a heat pack or soak the injury in warm water. SEEK IMMEDIATE MEDICAL CARE IF:  Treatment seems to offer no benefit, or the condition worsens.  Any medications produce adverse side effects. EXERCISES RANGE OF MOTION (ROM) AND STRETCHING EXERCISES - Plantar Fasciitis (Heel Spur Syndrome) These exercises may help you when beginning to rehabilitate your injury. Your symptoms may resolve with or without further involvement from your physician, physical  therapist or athletic trainer. While completing these exercises, remember:   Restoring tissue flexibility helps normal motion to return to the joints. This allows healthier, less painful movement and activity.  An effective stretch should be held for at least 30 seconds.  A stretch should never be painful. You should only feel a gentle lengthening or release in the stretched tissue. RANGE OF MOTION - Toe Extension, Flexion  Sit with your right / left leg crossed over your opposite knee.  Grasp your toes and gently pull them back toward the top of your foot. You should feel a stretch on the bottom of your toes and/or foot.  Hold this stretch for __________ seconds.  Now, gently pull your toes toward the bottom of your foot. You should feel a stretch on the top of your toes and or foot.  Hold this stretch for __________ seconds. Repeat __________ times. Complete this stretch __________ times per day.  RANGE OF MOTION - Ankle Dorsiflexion, Active Assisted  Remove shoes and sit on a chair that is  preferably not on a carpeted surface.  Place right / left foot under knee. Extend your opposite leg for support.  Keeping your heel down, slide your right / left foot back toward the chair until you feel a stretch at your ankle or calf. If you do not feel a stretch, slide your bottom forward to the edge of the chair, while still keeping your heel down.  Hold this stretch for __________ seconds. Repeat __________ times. Complete this stretch __________ times per day.  STRETCH - Gastroc, Standing  Place hands on wall.  Extend right / left leg, keeping the front knee somewhat bent.  Slightly point your toes inward on your back foot.  Keeping your right / left heel on the floor and your knee straight, shift your weight toward the wall, not allowing your back to arch.  You should feel a gentle stretch in the right / left calf. Hold this position for __________ seconds. Repeat __________ times. Complete this stretch __________ times per day. STRETCH - Soleus, Standing  Place hands on wall.  Extend right / left leg, keeping the other knee somewhat bent.  Slightly point your toes inward on your back foot.  Keep your right / left heel on the floor, bend your back knee, and slightly shift your weight over the back leg so that you feel a gentle stretch deep in your back calf.  Hold this position for __________ seconds. Repeat __________ times. Complete this stretch __________ times per day. STRETCH - Gastrocsoleus, Standing  Note: This exercise can place a lot of stress on your foot and ankle. Please complete this exercise only if specifically instructed by your caregiver.   Place the ball of your right / left foot on a step, keeping your other foot firmly on the same step.  Hold on to the wall or a rail for balance.  Slowly lift your other foot, allowing your body weight to press your heel down over the edge of the step.  You should feel a stretch in your right / left calf.  Hold  this position for __________ seconds.  Repeat this exercise with a slight bend in your right / left knee. Repeat __________ times. Complete this stretch __________ times per day.  STRENGTHENING EXERCISES - Plantar Fasciitis (Heel Spur Syndrome)  These exercises may help you when beginning to rehabilitate your injury. They may resolve your symptoms with or without further involvement from your physician, physical therapist  or Product/process development scientist. While completing these exercises, remember:   Muscles can gain both the endurance and the strength needed for everyday activities through controlled exercises.  Complete these exercises as instructed by your physician, physical therapist or athletic trainer. Progress the resistance and repetitions only as guided. STRENGTH - Towel Curls  Sit in a chair positioned on a non-carpeted surface.  Place your foot on a towel, keeping your heel on the floor.  Pull the towel toward your heel by only curling your toes. Keep your heel on the floor.  If instructed by your physician, physical therapist or athletic trainer, add ____________________ at the end of the towel. Repeat __________ times. Complete this exercise __________ times per day. STRENGTH - Ankle Inversion  Secure one end of a rubber exercise band/tubing to a fixed object (table, pole). Loop the other end around your foot just before your toes.  Place your fists between your knees. This will focus your strengthening at your ankle.  Slowly, pull your big toe up and in, making sure the band/tubing is positioned to resist the entire motion.  Hold this position for __________ seconds.  Have your muscles resist the band/tubing as it slowly pulls your foot back to the starting position. Repeat __________ times. Complete this exercises __________ times per day.  Document Released: 08/15/2005 Document Revised: 11/07/2011 Document Reviewed: 11/27/2008 [facility] Patient Information 2015 [place], [place].  This information is not intended to replace advice given to you by your health care provider. Make sure you discuss any questions you have with your health care provider.

## 2015-04-16 NOTE — Progress Notes (Signed)
   Subjective:    Patient ID: April Oliver, female    DOB: 08-15-1934, 81 y.o.   MRN: [id number]  HPI  81 year old female presents the office with concerns of bilateral heel pain which has been ongoing for greater than 6 months. She says the pain has been about the same. Her left is slowly worsening the right. She states that she has pain in the bottom of her heel which started off mostly in the mornings but has not become more consistent. Denies any swelling or redness. No history of injury or trauma. The pain does not wake up at night. No prior treatment for this. She states the pain is also start her on the offset aspect of her foot/ankle from walking differently. She also has bunions and a painful hammertoe in her right second toe. She wears a toe pad overlying the area. She is inquiring about if orthotics would be good for her. No other complaints at this time.   Review of Systems  All other systems reviewed and are negative.      Objective:   Physical Exam AAO x3, NAD DP/PT pulses palpable bilaterally, CRT less than 3 seconds Protective sensation intact with Simms Weinstein monofilament, vibratory sensation intact, Achilles tendon reflex intact Tenderness to palpation overlying the plantar medial tubercle of the calcaneus to bilateral heels at the insertion of the plantar fascia. There is no pain along the course of plantar fascia within the arch of the foot. There is no pain with lateral compression of the calcaneus or pain the vibratory sensation bilaterally. No pain on the posterior aspect of the calcaneus or along the course/insertion of the Achilles tendon. There is mild is coming along the course of the peroneal tendons inferior to lateral malleolus and just proximal to the fifth metatarsal base. The tendon appears to be intact. 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 The right second digit is overlapping the hallux. Decrease in medial arch height is  bilaterally. There is no overlying edema, erythema, increase in warmth bilaterally. No other areas of tenderness palpation or pain with vibratory sensation to the foot/ankle. MMT 5/5, ROM WNL No open lesions or pre-ulcerative lesions are identified. No pain with calf compression, swelling, warmth, erythema. Nails are grossly dystrophic, dystrophic, hypertrophic. She is unable to trim the nails herself. No surrounding erythema or drainage.       Assessment & Plan:  81 year old female bilateral heel pain, plantar fasciitis resulting compensation causing peroneal tendinitis; HAV, hammertoe deformity -X-rays were obtained and reviewed with the patient.  -Treatment options discussed including all alternatives, risks, and complications -Patient elects to proceed with steroid injection into the bilateral heels. Under sterile skin preparation, a total of 2.5cc of kenalog 10, 0.5% Marcaine plain, and 2% lidocaine plain were infiltrated into the symptomatic area without complication. A band-aid was applied. Patient tolerated the injection well without complication. Post-injection care with discussed with the patient. Discussed with the patient to ice the area over the next couple of days to help prevent a steroid flare.  -I discussed orthotics to the patient. She elects to proceed with this. She was scanned for orthotics today and there were sent to [facility] labs. -Ice and stretching activities daily. -Discussed shoe gear modifications -Continue offloading of the hammertoe. -Nail sharply debrided 10 without complications/bleeding. -Follow-up in 4 weeks or sooner if any problems arise. In the meantime, encouraged to call the office with any questions, concerns, change in symptoms.   Celesta Gentile, DPM

## 2015-04-17 DIAGNOSIS — M25561 Pain in right knee: Secondary | ICD-10-CM | POA: Diagnosis not present

## 2015-04-20 DIAGNOSIS — M79673 Pain in unspecified foot: Secondary | ICD-10-CM

## 2015-04-23 DIAGNOSIS — G8929 Other chronic pain: Secondary | ICD-10-CM | POA: Diagnosis not present

## 2015-04-23 DIAGNOSIS — M25569 Pain in unspecified knee: Secondary | ICD-10-CM | POA: Diagnosis not present

## 2015-04-23 DIAGNOSIS — S0990XA Unspecified injury of head, initial encounter: Secondary | ICD-10-CM | POA: Diagnosis not present

## 2015-04-23 DIAGNOSIS — M79606 Pain in leg, unspecified: Secondary | ICD-10-CM | POA: Diagnosis not present

## 2015-04-23 DIAGNOSIS — M47816 Spondylosis without myelopathy or radiculopathy, lumbar region: Secondary | ICD-10-CM | POA: Diagnosis not present

## 2015-04-23 DIAGNOSIS — M810 Age-related osteoporosis without current pathological fracture: Secondary | ICD-10-CM | POA: Diagnosis not present

## 2015-04-28 ENCOUNTER — Ambulatory Visit: Payer: Commercial Managed Care - HMO | Admitting: Podiatry

## 2015-05-05 DIAGNOSIS — M810 Age-related osteoporosis without current pathological fracture: Secondary | ICD-10-CM | POA: Diagnosis not present

## 2015-05-05 DIAGNOSIS — F17200 Nicotine dependence, unspecified, uncomplicated: Secondary | ICD-10-CM | POA: Diagnosis not present

## 2015-05-05 DIAGNOSIS — E039 Hypothyroidism, unspecified: Secondary | ICD-10-CM | POA: Diagnosis not present

## 2015-05-13 ENCOUNTER — Telehealth: Payer: Self-pay | Admitting: *Deleted

## 2015-05-13 NOTE — Telephone Encounter (Addendum)
Pt states she is going to make the appt tomorrow.  I spoke with pt and she wanted to know what to bring to the appt other than the Power[employer] she was to return, she was to be scanned tomorrow  I told pt to bring a pair of athletic shoes and a pair of casual shoes, that they were going to work at fitting the orthotics in the most shoes she would be wearing.

## 2015-05-14 ENCOUNTER — Encounter: Payer: Self-pay | Admitting: Podiatry

## 2015-05-14 ENCOUNTER — Ambulatory Visit (INDEPENDENT_AMBULATORY_CARE_PROVIDER_SITE_OTHER): Payer: Commercial Managed Care - HMO | Admitting: Podiatry

## 2015-05-14 ENCOUNTER — Telehealth: Payer: Self-pay | Admitting: *Deleted

## 2015-05-14 VITALS — BP 166/92 | HR 67 | Resp 18

## 2015-05-14 DIAGNOSIS — M722 Plantar fascial fibromatosis: Secondary | ICD-10-CM | POA: Diagnosis not present

## 2015-05-14 DIAGNOSIS — M779 Enthesopathy, unspecified: Secondary | ICD-10-CM

## 2015-05-14 NOTE — Telephone Encounter (Signed)
Pt states she picked up her orthotics today and they are hurting her.  I instructed pt to take the orthotics out of her shoes and not put them back in until tomorrow, and then again wear as long as comfortable, and take out when uncomfortable,until she is able to wear her entire waking day.  Pt states she is taking her husband to [facility] tomorrow, I suggested she begin again in the evening when she can change shoe conveniently.  Pt states understanding.

## 2015-05-15 NOTE — Progress Notes (Signed)
Patient ID: April Oliver, female   DOB: 02-08-1934, 81 y.o.   MRN: [id number]  Subjective: 81 year old female presents the office they felt valuation of bilateral heel pain and a pickup orthotics. She states that she had relief after the injections have pain did start to recur over the last week. She is continuing stretching, icing activities. She is requesting another shot at today's appointment. She denies any swelling or redness. The pain does not wake her up at night. No other complaints at this time in no acute changes. She denies any systemic complaints as fevers, chills, nausea, vomiting. No calf pain, chest pain, shortness of breath.  Objective: AAO x3, NAD DP/PT pulses palpable bilaterally, CRT less than 3 seconds Protective sensation intact with Simms Weinstein monofilament, vibratory sensation intact, Achilles tendon reflex intact There is continued tenderness to palpation overlying the plantar medial tubercle of the calcaneus to bilateral heels at the insertion of the plantar fascia. There is no pain along the course of plantar fascial within the arch of the foot. There is no pain with lateral compression of the calcaneus or pain the vibratory sensation. No pain on the posterior aspect of the calcaneus or along the course/insertion of the Achilles tendon. There is mild discomfort along the course of the peroneal tendons inferior to lateral malleolus and proximal to the fifth metatarsal base. There is no pain with eversion. The tendons appear to be intact. There is no overlying edema, erythema, increase in warmth.  No other areas of tenderness palpation or pain with vibratory sensation to the foot/ankle.  MMT 5/5, ROM WNL Bilateral HAV and hammertoe as her present. There is a decrease in medial arch height upon weightbearing bilaterally. No open lesions or pre-ulcerative lesions are identified. No pain with calf compression, swelling, warmth, erythema.  Assessment: 81 year old female  with continued heel pain likely result in compensation peroneal tendinitis  Plan: -Treatment options discussed including all alternatives, risks, and complications -Orthotics were dispensed to the patient today. Both written and oral break-in instructions were discussed. The orthotics were modified to fit in her shoes. Patient elects to proceed with steroid injection into bilateral heels. Under sterile skin preparation, a total of 2.5cc of kenalog 10, 0.5% Marcaine plain, and 2% lidocaine plain were infiltrated into the symptomatic area without complication. A band-aid was applied. Patient tolerated the injection well without complication. Post-injection care with discussed with the patient. Discussed with the patient to ice the area over the next couple of days to help prevent a steroid flare.  -Continue stretching and icing activities daily. -Continue supportive shoe gear. -Follow-up in 4 weeks or sooner if any problems arise. In the meantime, encouraged to call the office with any questions, concerns, change in symptoms.   Celesta Gentile, DPM

## 2015-05-19 ENCOUNTER — Telehealth: Payer: Self-pay | Admitting: *Deleted

## 2015-05-19 NOTE — Telephone Encounter (Addendum)
Pt states she is in a shoe store with the new orthotics and wants to talk to someone about them.  I called [phone], voicemail was full.  Unable to contact pt the voicemail is full.

## 2015-05-27 ENCOUNTER — Telehealth: Payer: Self-pay | Admitting: *Deleted

## 2015-05-27 NOTE — Telephone Encounter (Signed)
Pt asked of advice about shoes for her orthotics, everything is too heavy, her stockbroker suggested [employer] and [employer].  I told pt our office preferred [place], that certain types of Sketchers were flimpsy and not supportive.

## 2015-06-11 ENCOUNTER — Ambulatory Visit: Payer: Commercial Managed Care - HMO | Admitting: Podiatry

## 2015-06-17 ENCOUNTER — Encounter: Payer: Self-pay | Admitting: Pain Medicine

## 2015-06-25 ENCOUNTER — Ambulatory Visit: Payer: Self-pay | Admitting: Pain Medicine

## 2015-07-03 DIAGNOSIS — I10 Essential (primary) hypertension: Secondary | ICD-10-CM | POA: Insufficient documentation

## 2015-07-03 DIAGNOSIS — Z23 Encounter for immunization: Secondary | ICD-10-CM | POA: Diagnosis not present

## 2015-07-03 DIAGNOSIS — R350 Frequency of micturition: Secondary | ICD-10-CM | POA: Diagnosis not present

## 2015-07-16 ENCOUNTER — Other Ambulatory Visit: Payer: Self-pay | Admitting: Pain Medicine

## 2015-07-16 ENCOUNTER — Ambulatory Visit: Payer: Commercial Managed Care - HMO | Admitting: Podiatry

## 2015-07-20 ENCOUNTER — Encounter: Payer: Self-pay | Admitting: Pain Medicine

## 2015-07-20 DIAGNOSIS — M5416 Radiculopathy, lumbar region: Secondary | ICD-10-CM

## 2015-07-20 DIAGNOSIS — Z5181 Encounter for therapeutic drug level monitoring: Secondary | ICD-10-CM | POA: Insufficient documentation

## 2015-07-20 DIAGNOSIS — M47816 Spondylosis without myelopathy or radiculopathy, lumbar region: Secondary | ICD-10-CM | POA: Insufficient documentation

## 2015-07-20 DIAGNOSIS — F1120 Opioid dependence, uncomplicated: Secondary | ICD-10-CM | POA: Insufficient documentation

## 2015-07-20 DIAGNOSIS — M25562 Pain in left knee: Secondary | ICD-10-CM

## 2015-07-20 DIAGNOSIS — Z79891 Long term (current) use of opiate analgesic: Secondary | ICD-10-CM | POA: Insufficient documentation

## 2015-07-20 DIAGNOSIS — M545 Low back pain: Secondary | ICD-10-CM

## 2015-07-20 DIAGNOSIS — F1190 Opioid use, unspecified, uncomplicated: Secondary | ICD-10-CM | POA: Insufficient documentation

## 2015-07-20 DIAGNOSIS — G8929 Other chronic pain: Secondary | ICD-10-CM | POA: Insufficient documentation

## 2015-07-20 DIAGNOSIS — M25561 Pain in right knee: Secondary | ICD-10-CM

## 2015-07-21 NOTE — Telephone Encounter (Signed)
Please remind the patient and the pharmacy that my policy is not to fax or call any prescriptions. Patients must attend their appointments at which time, after a proper assessment, the prescriptions may be refilled, or the medication/dose changed, if necessary. If the patient feels that this needs to be addressed as soon as possible, please work the patient into an earlier appointment or a cancellation. 

## 2015-07-30 ENCOUNTER — Ambulatory Visit (INDEPENDENT_AMBULATORY_CARE_PROVIDER_SITE_OTHER): Payer: Commercial Managed Care - HMO | Admitting: Podiatry

## 2015-07-30 ENCOUNTER — Telehealth: Payer: Self-pay | Admitting: *Deleted

## 2015-07-30 ENCOUNTER — Encounter: Payer: Self-pay | Admitting: Podiatry

## 2015-07-30 VITALS — BP 139/82 | HR 80 | Resp 12

## 2015-07-30 DIAGNOSIS — M779 Enthesopathy, unspecified: Secondary | ICD-10-CM

## 2015-07-30 DIAGNOSIS — M722 Plantar fascial fibromatosis: Secondary | ICD-10-CM | POA: Diagnosis not present

## 2015-07-30 NOTE — Telephone Encounter (Addendum)
Pt states she was seen by Dr. Jacqualyn Posey today and placed in a brace and it is so painful she had to remove it, and the shot hurt her too.  Pt states not necessary to call back.  Pt called states she only wore the brace 1-1 1/2 hours and had to have her daughter come get her, and she had to go to bed.  I told the pt to stop the brace at this time, that her feet may be to inflamed, to use a towel covered ice pack to her feet 3-4 times a day for 10-15 minutes each episode and I would talk to Dr. Jacqualyn Posey and call again.  Pt states understanding.  07/31/2015 pt called twice stating her problem with the brace, then called a 3rd time stating the medication had not been called in.

## 2015-07-30 NOTE — Telephone Encounter (Signed)
Continue to ice and elevate. She cannot take NSAIDs she told me. I used the same steroid that she had before. The brace probably was tight due to swelling to her ankle.

## 2015-07-31 ENCOUNTER — Telehealth: Payer: Self-pay | Admitting: Podiatry

## 2015-07-31 MED ORDER — DICLOFENAC SODIUM 75 MG PO TBEC: 75.0000 mg | DELAYED_RELEASE_TABLET | Freq: Two times a day (BID) | ORAL | Status: DC

## 2015-07-31 NOTE — Telephone Encounter (Signed)
Hold off on the brace. It may because of the swelling. If she can do voltaren then please order that 75mg  BID. I thought she said no NSAIDs but I must be wrong.

## 2015-07-31 NOTE — Progress Notes (Signed)
Patient ID: April Oliver, female   DOB: Feb 18, 1934, 81 y.o.   MRN: [id number]  Subjective: 81 year old female presents the office today for follow-up evaluation of bilateral heel pain. She states that she does continue some mild discomfort to her heels. She has not been wearing orthotics. She states that she has been on her feet quite a bit over the last month as her husband recently passed away and she was in the hospital due to a lot of walking. She states that she is starting to develop the swelling to the inside portion of her right ankle and tenderness overlying this area as well. She denies any recent injury or trauma. There's been no associated erythema or warmth. She is a question steroid injections were heels is appointment. No other complaints at this time. She denies any systemic complaints as fevers, chills, nausea, vomiting. No calf pain, chest pain, shortness of breath.  Objective: AAO x3, NAD DP/PT pulses palpable bilaterally, CRT less than 3 seconds Protective sensation intact with Simms Weinstein monofilament There is continued but somewhat improved tenderness to palpation overlying the plantar medial tubercle of the calcaneus to bilateral heels at the insertion of the plantar fascia. There is no pain along the course of plantar fascial within the arch of the foot. There is no pain with lateral compression of the calcaneus or pain the vibratory sensation. No pain on the posterior aspect of the calcaneus or along the course/insertion of the Achilles tendon. There is no discomfort along the course of the peroneal tendons inferior to lateral malleolus and proximal to the fifth metatarsal base. On the right side there is tenderness along the course of the flexor tendons just posterior to the medial malleolus. There is localized edema to this area without any significant for overlying erythema or increase in warmth. The tendons appear to be intact. There is no overlying edema, erythema,  increase in warmth to other areas of the foot/ankle.   No other areas of tenderness palpation or pain with vibratory sensation to the foot/ankle.  MMT 5/5, ROM WNL Bilateral HAV and hammertoe as her present. There is a decrease in medial arch height upon weightbearing bilaterally. No open lesions or pre-ulcerative lesions are identified. No pain with calf compression, swelling, warmth, erythema.  Assessment: 81 year old female with continued heel pain likely result in compensation peroneal tendinitis And posterior tibial tendinitis  Plan: -Treatment options discussed including all alternatives, risks, and complications -Patient elects to proceed with steroid injection into bilateral heels. Under sterile skin preparation, a total of 2.5cc of kenalog 10, 0.5% Marcaine plain, and 2% lidocaine plain were infiltrated into the symptomatic area without complication. A band-aid was applied. Patient tolerated the injection well without complication. Post-injection care with discussed with the patient. Discussed with the patient to ice the area over the next couple of days to help prevent a steroid flare.  -Dispensed ankle brace for the right ankle. -Recommended continue with orthotics. She did bring and multiple pairs of shoes today which we did discuss and evaluate she tried on with her orthotics. -Continue stretching and icing activities daily. -Continue supportive shoe gear. -Follow-up in 4 weeks or sooner if any problems arise. In the meantime, encouraged to call the office with any questions, concerns, change in symptoms.   Celesta Gentile, DPM

## 2015-07-31 NOTE — Telephone Encounter (Signed)
Pt called and said you were to be calling in an anti inflammatory medication for her and the pharmacy does not have it. Please call pt and let her know if and when done. [facility]

## 2015-07-31 NOTE — Telephone Encounter (Signed)
Pt states she is able to take Aleve and Ibuprofen.  I informed pt we would call in a antiinflammatory that would help with the pain and swelling.  Pt agreed and I told her the Voltaren would be called to [facility].

## 2015-07-31 NOTE — Telephone Encounter (Signed)
Please do voltaren 75mg  BID disp #30 1 refill. Thanks.

## 2015-08-27 ENCOUNTER — Ambulatory Visit: Payer: Commercial Managed Care - HMO | Admitting: Podiatry

## 2015-09-02 DIAGNOSIS — H43813 Vitreous degeneration, bilateral: Secondary | ICD-10-CM | POA: Diagnosis not present

## 2015-09-17 DIAGNOSIS — R309 Painful micturition, unspecified: Secondary | ICD-10-CM | POA: Diagnosis not present

## 2015-09-23 DIAGNOSIS — I10 Essential (primary) hypertension: Secondary | ICD-10-CM | POA: Diagnosis not present

## 2015-09-23 DIAGNOSIS — E039 Hypothyroidism, unspecified: Secondary | ICD-10-CM | POA: Diagnosis not present

## 2015-10-05 DIAGNOSIS — G8929 Other chronic pain: Secondary | ICD-10-CM | POA: Diagnosis not present

## 2015-10-05 DIAGNOSIS — E039 Hypothyroidism, unspecified: Secondary | ICD-10-CM | POA: Diagnosis not present

## 2015-10-05 DIAGNOSIS — I10 Essential (primary) hypertension: Secondary | ICD-10-CM | POA: Diagnosis not present

## 2015-10-05 DIAGNOSIS — M542 Cervicalgia: Secondary | ICD-10-CM | POA: Diagnosis not present

## 2015-10-05 DIAGNOSIS — F325 Major depressive disorder, single episode, in full remission: Secondary | ICD-10-CM | POA: Diagnosis not present

## 2015-11-19 DIAGNOSIS — G8929 Other chronic pain: Secondary | ICD-10-CM | POA: Diagnosis not present

## 2015-11-19 DIAGNOSIS — M5441 Lumbago with sciatica, right side: Secondary | ICD-10-CM | POA: Diagnosis not present

## 2015-11-19 DIAGNOSIS — Z96651 Presence of right artificial knee joint: Secondary | ICD-10-CM | POA: Diagnosis not present

## 2015-12-17 ENCOUNTER — Other Ambulatory Visit: Payer: Self-pay | Admitting: Orthopedic Surgery

## 2015-12-17 DIAGNOSIS — M5442 Lumbago with sciatica, left side: Principal | ICD-10-CM

## 2015-12-17 DIAGNOSIS — G8929 Other chronic pain: Secondary | ICD-10-CM

## 2015-12-17 DIAGNOSIS — M5441 Lumbago with sciatica, right side: Principal | ICD-10-CM

## 2015-12-29 DIAGNOSIS — E039 Hypothyroidism, unspecified: Secondary | ICD-10-CM | POA: Diagnosis not present

## 2015-12-29 DIAGNOSIS — I10 Essential (primary) hypertension: Secondary | ICD-10-CM | POA: Diagnosis not present

## 2016-01-04 DIAGNOSIS — E039 Hypothyroidism, unspecified: Secondary | ICD-10-CM | POA: Diagnosis not present

## 2016-01-04 DIAGNOSIS — I10 Essential (primary) hypertension: Secondary | ICD-10-CM | POA: Diagnosis not present

## 2016-01-04 DIAGNOSIS — M5441 Lumbago with sciatica, right side: Secondary | ICD-10-CM | POA: Diagnosis not present

## 2016-01-04 DIAGNOSIS — G8929 Other chronic pain: Secondary | ICD-10-CM | POA: Diagnosis not present

## 2016-01-06 ENCOUNTER — Ambulatory Visit
Admission: RE | Admit: 2016-01-06 | Discharge: 2016-01-06 | Disposition: A | Discharge status: Home / Self Care | Payer: Commercial Managed Care - HMO | Source: Ambulatory Visit | Attending: Orthopedic Surgery | Admitting: Orthopedic Surgery

## 2016-01-06 DIAGNOSIS — M5126 Other intervertebral disc displacement, lumbar region: Secondary | ICD-10-CM | POA: Diagnosis not present

## 2016-01-06 DIAGNOSIS — G8929 Other chronic pain: Secondary | ICD-10-CM | POA: Insufficient documentation

## 2016-01-06 DIAGNOSIS — Z981 Arthrodesis status: Secondary | ICD-10-CM | POA: Insufficient documentation

## 2016-01-06 DIAGNOSIS — M5442 Lumbago with sciatica, left side: Secondary | ICD-10-CM | POA: Diagnosis not present

## 2016-01-06 DIAGNOSIS — M5441 Lumbago with sciatica, right side: Secondary | ICD-10-CM | POA: Insufficient documentation

## 2016-01-06 DIAGNOSIS — M4806 Spinal stenosis, lumbar region: Secondary | ICD-10-CM | POA: Diagnosis not present

## 2016-01-06 LAB — POCT I-STAT CREATININE: CREATININE: 0.8 mg/dL (ref 0.44–1.00)

## 2016-01-06 MED ORDER — GADOBENATE DIMEGLUMINE 529 MG/ML IV SOLN
10.0000 mL | Freq: Once | INTRAVENOUS | Status: AC | PRN
  Administered 2016-01-06: 10 mL via INTRAVENOUS

## 2016-01-08 DIAGNOSIS — R350 Frequency of micturition: Secondary | ICD-10-CM | POA: Diagnosis not present

## 2016-01-12 DIAGNOSIS — M4806 Spinal stenosis, lumbar region: Secondary | ICD-10-CM | POA: Diagnosis not present

## 2016-01-12 DIAGNOSIS — M5416 Radiculopathy, lumbar region: Secondary | ICD-10-CM | POA: Diagnosis not present

## 2016-01-12 DIAGNOSIS — M9963 Osseous and subluxation stenosis of intervertebral foramina of lumbar region: Secondary | ICD-10-CM | POA: Diagnosis not present

## 2016-01-17 DIAGNOSIS — M9963 Osseous and subluxation stenosis of intervertebral foramina of lumbar region: Secondary | ICD-10-CM | POA: Insufficient documentation

## 2016-01-17 DIAGNOSIS — M48061 Spinal stenosis, lumbar region without neurogenic claudication: Secondary | ICD-10-CM | POA: Insufficient documentation

## 2016-01-29 DIAGNOSIS — M62830 Muscle spasm of back: Secondary | ICD-10-CM | POA: Diagnosis not present

## 2016-02-25 DIAGNOSIS — M4150 Other secondary scoliosis, site unspecified: Secondary | ICD-10-CM | POA: Diagnosis not present

## 2016-02-25 DIAGNOSIS — M4727 Other spondylosis with radiculopathy, lumbosacral region: Secondary | ICD-10-CM | POA: Diagnosis not present

## 2016-03-03 ENCOUNTER — Other Ambulatory Visit: Payer: Self-pay | Admitting: Neurological Surgery

## 2016-03-03 DIAGNOSIS — G7289 Other specified myopathies: Secondary | ICD-10-CM

## 2016-03-03 DIAGNOSIS — Q7969 Other Ehlers-Danlos syndromes: Principal | ICD-10-CM

## 2016-03-03 DIAGNOSIS — Q675 Congenital deformity of spine: Principal | ICD-10-CM

## 2016-03-03 DIAGNOSIS — H905 Unspecified sensorineural hearing loss: Principal | ICD-10-CM

## 2016-03-08 ENCOUNTER — Other Ambulatory Visit: Payer: Self-pay | Admitting: Neurological Surgery

## 2016-03-08 DIAGNOSIS — E2839 Other primary ovarian failure: Secondary | ICD-10-CM

## 2016-03-29 ENCOUNTER — Ambulatory Visit (INDEPENDENT_AMBULATORY_CARE_PROVIDER_SITE_OTHER): Payer: Commercial Managed Care - HMO | Admitting: Podiatry

## 2016-03-29 ENCOUNTER — Encounter: Payer: Self-pay | Admitting: Podiatry

## 2016-03-29 VITALS — BP 124/73 | HR 69 | Resp 16

## 2016-03-29 DIAGNOSIS — M722 Plantar fascial fibromatosis: Secondary | ICD-10-CM | POA: Diagnosis not present

## 2016-04-01 DIAGNOSIS — I10 Essential (primary) hypertension: Secondary | ICD-10-CM | POA: Diagnosis not present

## 2016-04-01 DIAGNOSIS — M5416 Radiculopathy, lumbar region: Secondary | ICD-10-CM | POA: Diagnosis not present

## 2016-04-01 DIAGNOSIS — F324 Major depressive disorder, single episode, in partial remission: Secondary | ICD-10-CM | POA: Diagnosis not present

## 2016-04-01 DIAGNOSIS — E039 Hypothyroidism, unspecified: Secondary | ICD-10-CM | POA: Diagnosis not present

## 2016-04-03 NOTE — Progress Notes (Signed)
Subjective: 82 year old female presents the office if her reoccurrence of bilateral heel pain and she is requesting steroid injections. She states that she has pain when she gets up after periods of prolonged standing or walking. She describes as a throbbing sensation the bottom of her heel. Denies a numbness or tingling. No swelling or redness. Denies any recent injury or trauma. No recent treatment.Denies any systemic complaints such as fevers, chills, nausea, vomiting. No acute changes since last appointment, and no other complaints at this time.   Objective: AAO x3, NAD DP/PT pulses palpable bilaterally, CRT less than 3 seconds Tenderness to palpation along the plantar medial tubercle of the calcaneus at the insertion of plantar fascia on the left and right foot. There is no pain along the course of the plantar fascia within the arch of the foot. Plantar fascia appears to be intact. There is no pain with lateral compression of the calcaneus or pain with vibratory sensation. There is no pain along the course or insertion of the achilles tendon. No other areas of tenderness to bilateral lower extremities. MMT 5/5, ROM WNL. Equinus is present.  No edema, erythema, increase in warmth to bilateral lower extremities.  No open lesions or pre-ulcerative lesions.  No pain with calf compression, swelling, warmth, erythema  Assessment: Bilateral heel pain, likely plantar fasciitis recurrence  Plan: -All treatment options discussed with the patient including all alternatives, risks, complications.  -Patient elects to proceed with steroid injection into the left and right heel. Under sterile skin preparation, a total of 2.5cc of kenalog 10, 0.5% Marcaine plain, and 2% lidocaine plain were infiltrated into the symptomatic area without complication. A band-aid was applied. Patient tolerated the injection well without complication. Post-injection care with discussed with the patient. Discussed with the patient  to ice the area over the next couple of days to help prevent a steroid flare.  -I discussed shoe gear modifications and long-term orthotics. -Continue stretching, icing exercises daily -Patient encouraged to call the office with any questions, concerns, change in symptoms.   Celesta Gentile, DPM

## 2016-04-07 ENCOUNTER — Other Ambulatory Visit: Payer: Self-pay | Admitting: Neurological Surgery

## 2016-04-11 ENCOUNTER — Telehealth: Payer: Self-pay | Admitting: Vascular Surgery

## 2016-04-11 NOTE — Telephone Encounter (Signed)
Sched appt 9/13 at 11:00. Hm# not working, lm on daughter's # to inform them of appt.

## 2016-04-11 NOTE — Telephone Encounter (Signed)
-----   Message from Denman George, RN sent at 04/08/2016  4:45 PM EDT ----- Regarding: needs office consult with Dr. Donnetta Hutching Please schedule for new pt consult with Dr. Donnetta Hutching prior to 05/23/16; (Dr. Donnetta Hutching is assisting Dr. Cyndy Freeze with [facility])  Please remind her to bring copy of LS spine films to appt.

## 2016-04-12 ENCOUNTER — Ambulatory Visit (INDEPENDENT_AMBULATORY_CARE_PROVIDER_SITE_OTHER): Payer: Commercial Managed Care - HMO | Admitting: Podiatry

## 2016-04-12 ENCOUNTER — Encounter: Payer: Self-pay | Admitting: Podiatry

## 2016-04-12 DIAGNOSIS — M722 Plantar fascial fibromatosis: Secondary | ICD-10-CM

## 2016-04-12 NOTE — Progress Notes (Signed)
Subjective: 82 year old female presents the office for follow-up evaluation of bilateral heel pain. She says the pain has improved but she still gets pain to the bottom of her heels. She has also tried changing shoes as well. Denies any recent injury or trauma. No recent treatment.Denies any systemic complaints such as fevers, chills, nausea, vomiting. No acute changes since last appointment, and no other complaints at this time.   Objective: AAO x3, NAD DP/PT pulses palpable bilaterally, CRT less than 3 seconds There is continued tenderness to palpation along the plantar medial tubercle of the calcaneus at the insertion of plantar fascia on the left and right foot. There is no pain along the course of the plantar fascia within the arch of the foot. Plantar fascia appears to be intact. There is no pain with lateral compression of the calcaneus or pain with vibratory sensation. There is no pain along the course or insertion of the achilles tendon. No other areas of tenderness to bilateral lower extremities. MMT 5/5, ROM WNL. Equinus is present.  No edema, erythema, increase in warmth to bilateral lower extremities.  No open lesions or pre-ulcerative lesions.  No pain with calf compression, swelling, warmth, erythema  Assessment: Bilateral heel pain, likely plantar fasciitis   Plan: -All treatment options discussed with the patient including all alternatives, risks, complications.  -Patient elects to proceed with steroid injection into the left and right heel. Under sterile skin preparation, a total of 2.5cc of kenalog 10, 0.5% Marcaine plain, and 2% lidocaine plain were infiltrated into the symptomatic area without complication. A band-aid was applied. Patient tolerated the injection well without complication. Post-injection care with discussed with the patient. Discussed with the patient to ice the area over the next couple of days to help prevent a steroid flare.  -I discussed shoe gear  modifications and long-term orthotics. -Continue stretching, icing exercises daily -Follow-up in 4 weeks or sooner if any problems arise. In the meantime, encouraged to call the office with any questions, concerns, change in symptoms.   Celesta Gentile, DPM

## 2016-05-11 ENCOUNTER — Encounter: Payer: Self-pay | Admitting: Vascular Surgery

## 2016-05-12 ENCOUNTER — Encounter: Payer: Self-pay | Admitting: Podiatry

## 2016-05-12 ENCOUNTER — Ambulatory Visit (INDEPENDENT_AMBULATORY_CARE_PROVIDER_SITE_OTHER): Payer: Commercial Managed Care - HMO | Admitting: Podiatry

## 2016-05-12 ENCOUNTER — Telehealth: Payer: Self-pay

## 2016-05-12 DIAGNOSIS — M79675 Pain in left toe(s): Secondary | ICD-10-CM | POA: Diagnosis not present

## 2016-05-12 DIAGNOSIS — B351 Tinea unguium: Secondary | ICD-10-CM

## 2016-05-12 DIAGNOSIS — M79674 Pain in right toe(s): Secondary | ICD-10-CM

## 2016-05-12 DIAGNOSIS — M722 Plantar fascial fibromatosis: Secondary | ICD-10-CM

## 2016-05-12 NOTE — Telephone Encounter (Signed)
Today, I recommeded starting physical therapy. She did not have an injection or anything. I would continue stretching, icing, PT. She also has a lot of back issues that this could be coming from.

## 2016-05-12 NOTE — Telephone Encounter (Signed)
Pt called stating that she was seen in our office today, she is currently in a lot of pain in both of her feet and the pain shoots up her left leg and into her buttocks. Advised her to take Tramadol that was prescribed to her from another physician, elevate her feet and try warm epsom salt soaks to see if that alleviated her pain.

## 2016-05-13 ENCOUNTER — Other Ambulatory Visit: Payer: Self-pay

## 2016-05-13 DIAGNOSIS — M722 Plantar fascial fibromatosis: Secondary | ICD-10-CM

## 2016-05-13 NOTE — Progress Notes (Signed)
Subjective: 82 year old female presents the office for follow-up evaluation of bilateral heel pain. She states the injections have helped some but she does not get good relief from them. She has not tried new shoes and she has not tried her inserts recently. She has operative stretching or icing. She has decided not to undergo back surgery. She currently denies any numbness or tingling to her feet. She'll also for her nails to be trimmed today as they're thickened elongated she cannot trim them herself. Denies any systemic complaints such as fevers, chills, nausea, vomiting. No acute changes since last appointment, and no other complaints at this time.   Objective: AAO x3, NAD DP/PT pulses palpable bilaterally, CRT less than 3 seconds There is continued, but subjectively somewhat improved, tenderness to palpation along the plantar medial tubercle of the calcaneus at the insertion of plantar fascia on the left and right foot. There is no pain along the course of the plantar fascia within the arch of the foot. Plantar fascia appears to be intact. There is no pain with lateral compression of the calcaneus or pain with vibratory sensation. There is no pain along the course or insertion of the achilles tendon. No other areas of tenderness to bilateral lower extremities. MMT 5/5, ROM WNL. Equinus is present.  Nails are hypertrophic, dystrophic, brittle, discolored, elongated 10. There is no surrounding redness or drainage. No edema, erythema, increase in warmth to bilateral lower extremities.  No open lesions or pre-ulcerative lesions.  No pain with calf compression, swelling, warmth, erythema  Assessment: Bilateral heel pain, likely plantar fasciitis; symptomatic onychomycosis  Plan: -All treatment options discussed with the patient including all alternatives, risks, complications.  -At this time she is not getting much relief with steroid injections to her and hold off on this. At this time I do  recommend physical therapy should have this calmed her house and I will order home PT. I discussed their shoe gear changes I discussed orthotics as well. I also discussed that he continue stretching, icing exercises daily basis. -Continue stretching, icing exercises daily -Nails debrided 10 without complications or bleeding. -Follow-up in 4 weeks or sooner if any problems arise. In the meantime, encouraged to call the office with any questions, concerns, change in symptoms.   April Oliver, DPM

## 2016-05-18 DIAGNOSIS — M722 Plantar fascial fibromatosis: Secondary | ICD-10-CM | POA: Diagnosis not present

## 2016-05-18 DIAGNOSIS — R262 Difficulty in walking, not elsewhere classified: Secondary | ICD-10-CM | POA: Diagnosis not present

## 2016-05-20 ENCOUNTER — Telehealth: Payer: Self-pay | Admitting: *Deleted

## 2016-05-20 NOTE — Telephone Encounter (Addendum)
[facility] - called for verbal orders for stretches for plantar fasciitis, strengthening, balance and gait exercises. Unable to leave message okaying recommended plan of therapy. Unable to leave message to verbally okay PT evaluation. 05/23/2016-Pt states Lestine Mount, PT has not gotten orders for her.  I called pt and spoke with Skeet Simmer, PT and okayed the recommended therapies per Dr. Jacqualyn Posey.

## 2016-05-23 ENCOUNTER — Inpatient Hospital Stay (HOSPITAL_COMMUNITY)
Admission: RE | Admit: 2016-05-23 | Payer: Commercial Managed Care - HMO | Source: Ambulatory Visit | Admitting: Neurological Surgery

## 2016-05-23 ENCOUNTER — Encounter (HOSPITAL_COMMUNITY): Admission: RE | Payer: Self-pay | Source: Ambulatory Visit

## 2016-05-23 DIAGNOSIS — R262 Difficulty in walking, not elsewhere classified: Secondary | ICD-10-CM | POA: Diagnosis not present

## 2016-05-23 DIAGNOSIS — M722 Plantar fascial fibromatosis: Secondary | ICD-10-CM | POA: Diagnosis not present

## 2016-05-23 SURGERY — ANTERIOR LUMBAR FUSION 1 LEVEL
Anesthesia: General

## 2016-05-24 ENCOUNTER — Encounter (HOSPITAL_COMMUNITY): Admission: RE | Payer: Self-pay | Source: Ambulatory Visit

## 2016-05-24 ENCOUNTER — Inpatient Hospital Stay (HOSPITAL_COMMUNITY)
Admission: RE | Admit: 2016-05-24 | Payer: Commercial Managed Care - HMO | Source: Ambulatory Visit | Admitting: Neurological Surgery

## 2016-05-24 SURGERY — POSTERIOR LUMBAR FUSION 1 LEVEL
Anesthesia: General

## 2016-05-26 DIAGNOSIS — M722 Plantar fascial fibromatosis: Secondary | ICD-10-CM | POA: Diagnosis not present

## 2016-05-26 DIAGNOSIS — R262 Difficulty in walking, not elsewhere classified: Secondary | ICD-10-CM | POA: Diagnosis not present

## 2016-05-30 DIAGNOSIS — R262 Difficulty in walking, not elsewhere classified: Secondary | ICD-10-CM | POA: Diagnosis not present

## 2016-05-30 DIAGNOSIS — M722 Plantar fascial fibromatosis: Secondary | ICD-10-CM | POA: Diagnosis not present

## 2016-06-03 DIAGNOSIS — R262 Difficulty in walking, not elsewhere classified: Secondary | ICD-10-CM | POA: Diagnosis not present

## 2016-06-03 DIAGNOSIS — M722 Plantar fascial fibromatosis: Secondary | ICD-10-CM | POA: Diagnosis not present

## 2016-06-07 DIAGNOSIS — M722 Plantar fascial fibromatosis: Secondary | ICD-10-CM | POA: Diagnosis not present

## 2016-06-07 DIAGNOSIS — R262 Difficulty in walking, not elsewhere classified: Secondary | ICD-10-CM | POA: Diagnosis not present

## 2016-06-13 DIAGNOSIS — M722 Plantar fascial fibromatosis: Secondary | ICD-10-CM | POA: Diagnosis not present

## 2016-06-13 DIAGNOSIS — R262 Difficulty in walking, not elsewhere classified: Secondary | ICD-10-CM | POA: Diagnosis not present

## 2016-06-14 DIAGNOSIS — M722 Plantar fascial fibromatosis: Secondary | ICD-10-CM | POA: Diagnosis not present

## 2016-06-14 DIAGNOSIS — R262 Difficulty in walking, not elsewhere classified: Secondary | ICD-10-CM | POA: Diagnosis not present

## 2016-06-17 DIAGNOSIS — M722 Plantar fascial fibromatosis: Secondary | ICD-10-CM | POA: Diagnosis not present

## 2016-06-17 DIAGNOSIS — R262 Difficulty in walking, not elsewhere classified: Secondary | ICD-10-CM | POA: Diagnosis not present

## 2016-06-21 DIAGNOSIS — M722 Plantar fascial fibromatosis: Secondary | ICD-10-CM | POA: Diagnosis not present

## 2016-06-21 DIAGNOSIS — R262 Difficulty in walking, not elsewhere classified: Secondary | ICD-10-CM | POA: Diagnosis not present

## 2016-06-28 DIAGNOSIS — M722 Plantar fascial fibromatosis: Secondary | ICD-10-CM | POA: Diagnosis not present

## 2016-06-28 DIAGNOSIS — R262 Difficulty in walking, not elsewhere classified: Secondary | ICD-10-CM | POA: Diagnosis not present

## 2016-07-04 DIAGNOSIS — R262 Difficulty in walking, not elsewhere classified: Secondary | ICD-10-CM | POA: Diagnosis not present

## 2016-07-04 DIAGNOSIS — M722 Plantar fascial fibromatosis: Secondary | ICD-10-CM | POA: Diagnosis not present

## 2016-07-05 DIAGNOSIS — F325 Major depressive disorder, single episode, in full remission: Secondary | ICD-10-CM | POA: Diagnosis not present

## 2016-07-05 DIAGNOSIS — Z23 Encounter for immunization: Secondary | ICD-10-CM | POA: Diagnosis not present

## 2016-07-05 DIAGNOSIS — L03115 Cellulitis of right lower limb: Secondary | ICD-10-CM | POA: Diagnosis not present

## 2016-07-05 DIAGNOSIS — E039 Hypothyroidism, unspecified: Secondary | ICD-10-CM | POA: Diagnosis not present

## 2016-07-05 DIAGNOSIS — I10 Essential (primary) hypertension: Secondary | ICD-10-CM | POA: Diagnosis not present

## 2016-07-12 DIAGNOSIS — M722 Plantar fascial fibromatosis: Secondary | ICD-10-CM | POA: Diagnosis not present

## 2016-07-12 DIAGNOSIS — R262 Difficulty in walking, not elsewhere classified: Secondary | ICD-10-CM | POA: Diagnosis not present

## 2016-07-15 IMAGING — CR DG LUMBAR SPINE COMPLETE W/ BEND
1 series · 8 of 8 positions shown · non-contrast
Comparison: None.

CLINICAL DATA: Chronic low back pain

EXAM:
LUMBAR SPINE - COMPLETE WITH BENDING VIEWS

[Series 1: w lumbar spine lat · 0.14mm/px · 8 of 8 slices shown]
[im 1/8]
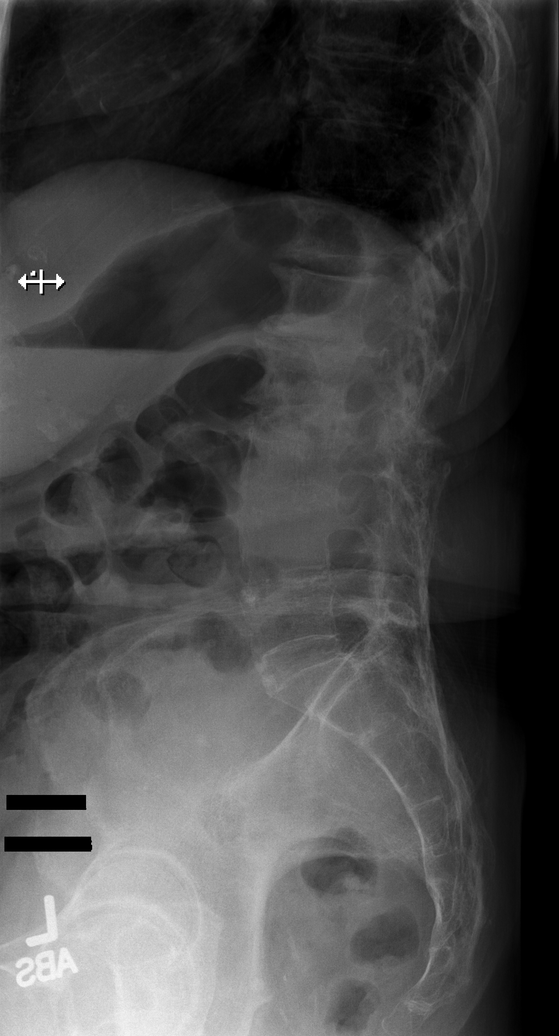
[im 2/8]
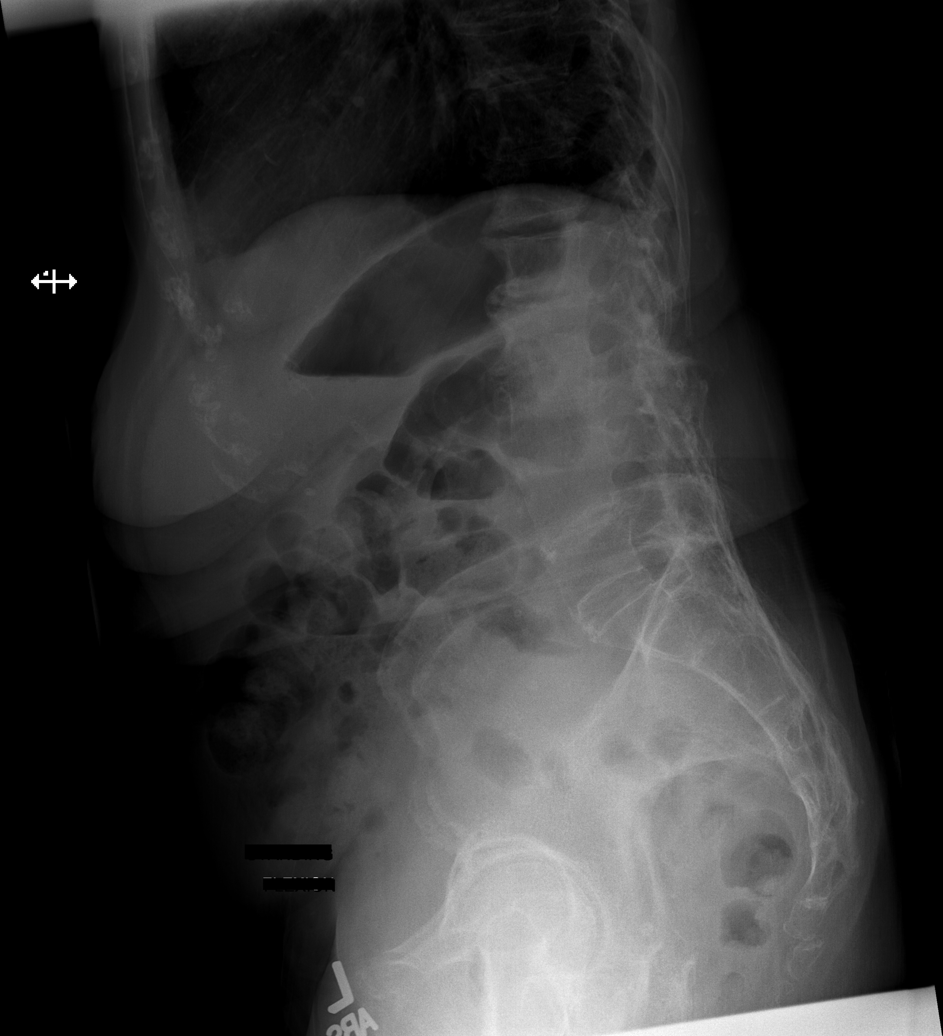
[im 3/8]
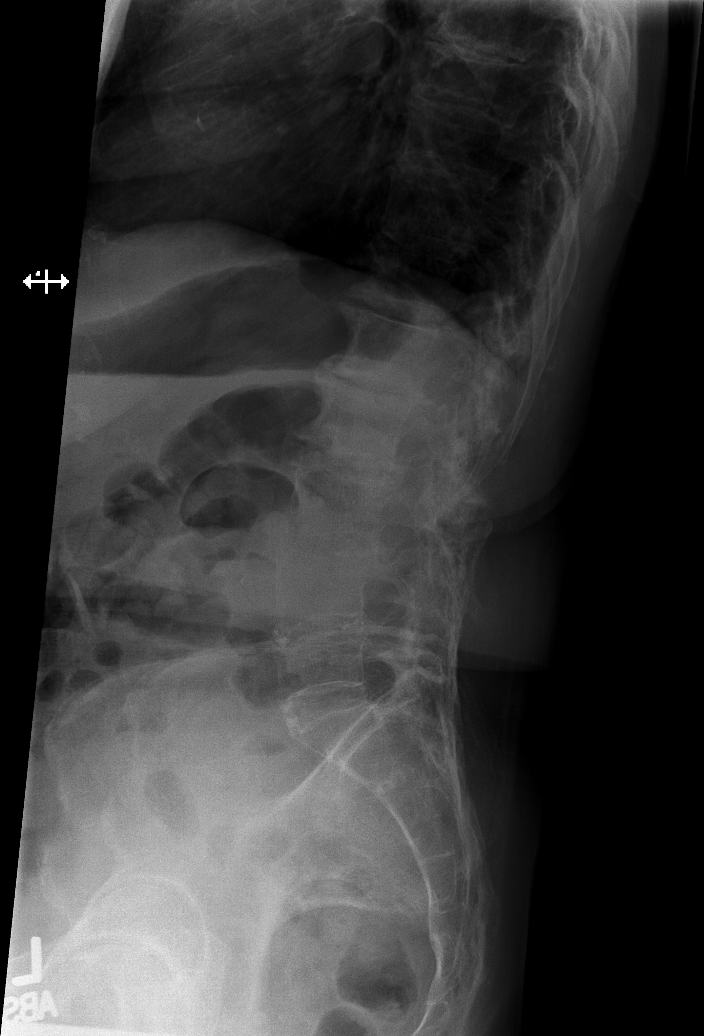
[im 4/8]
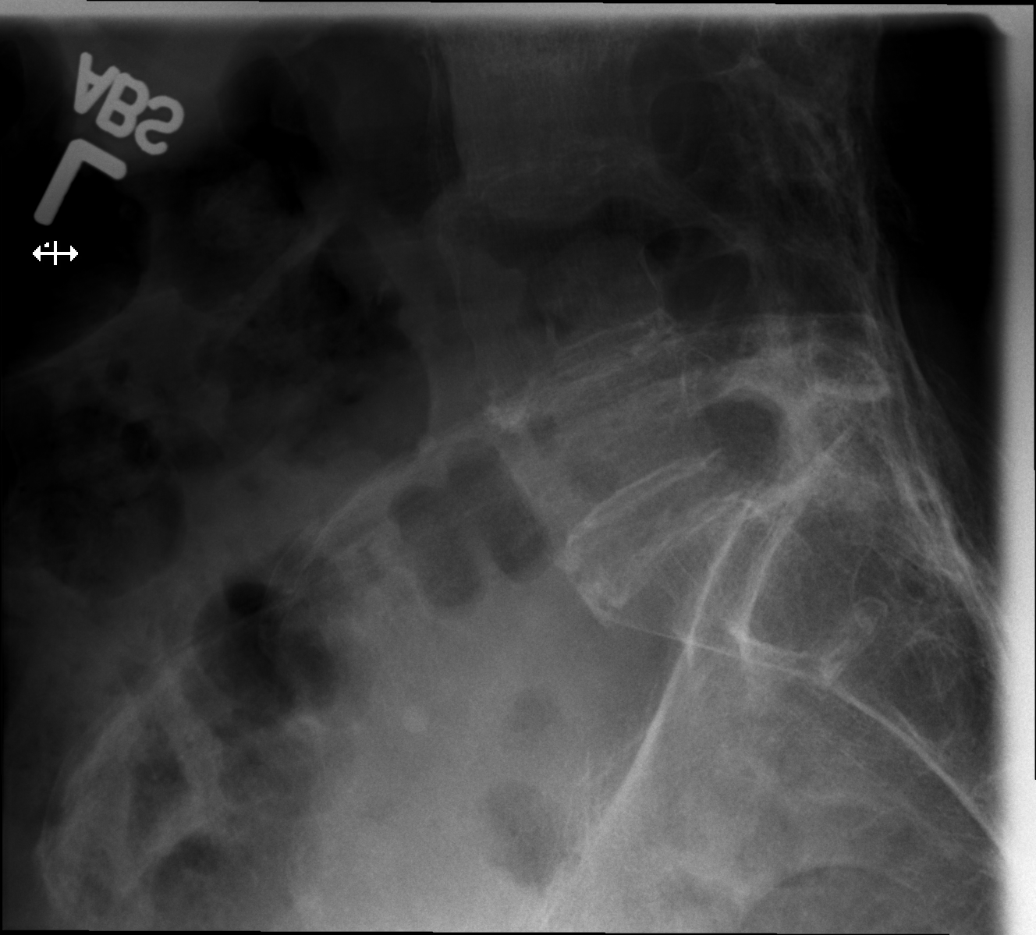
[im 5/8]
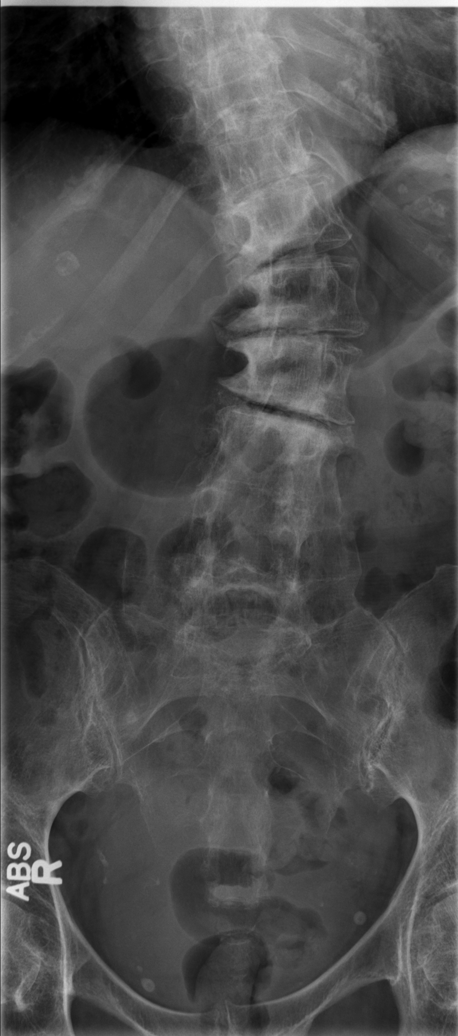
[im 6/8]
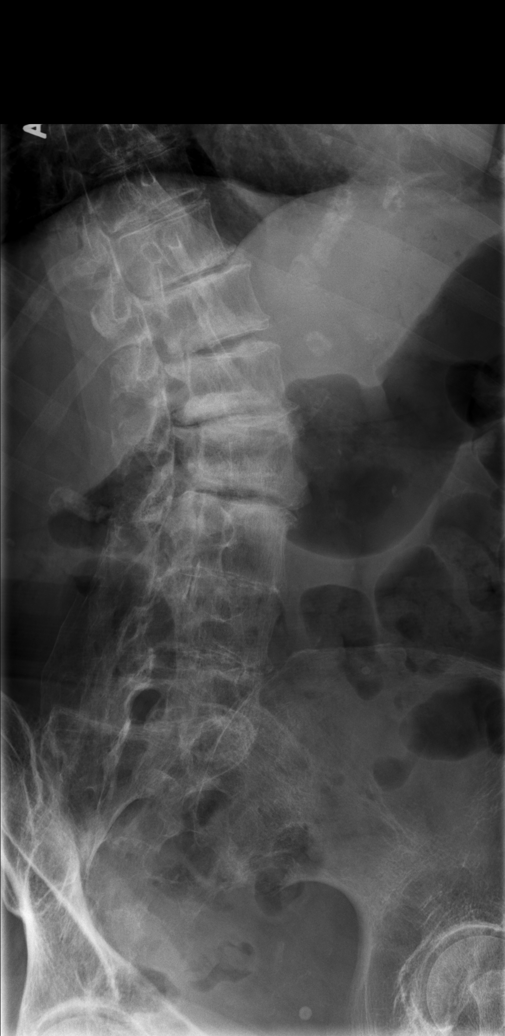
[im 7/8]
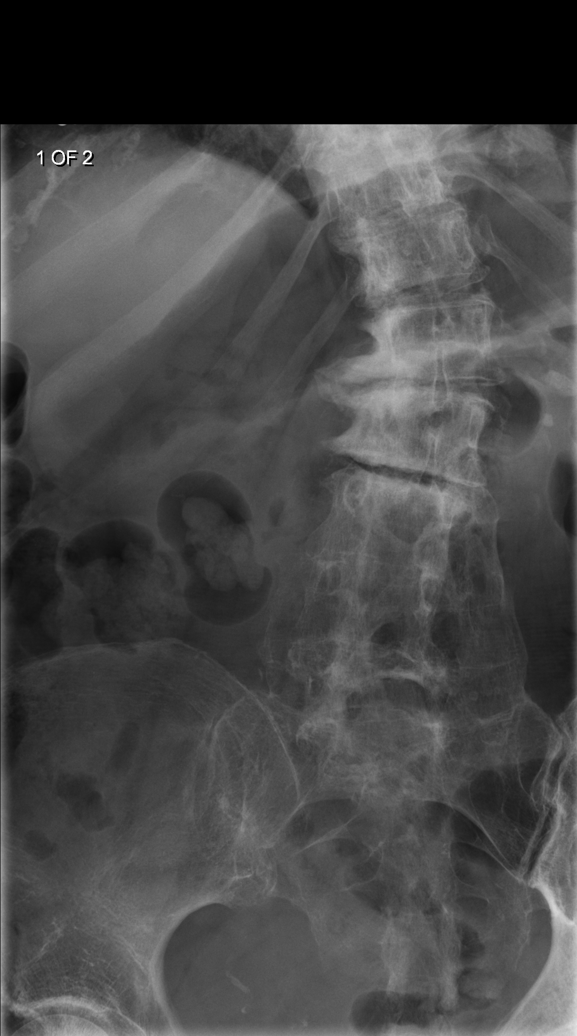
[im 8/8]
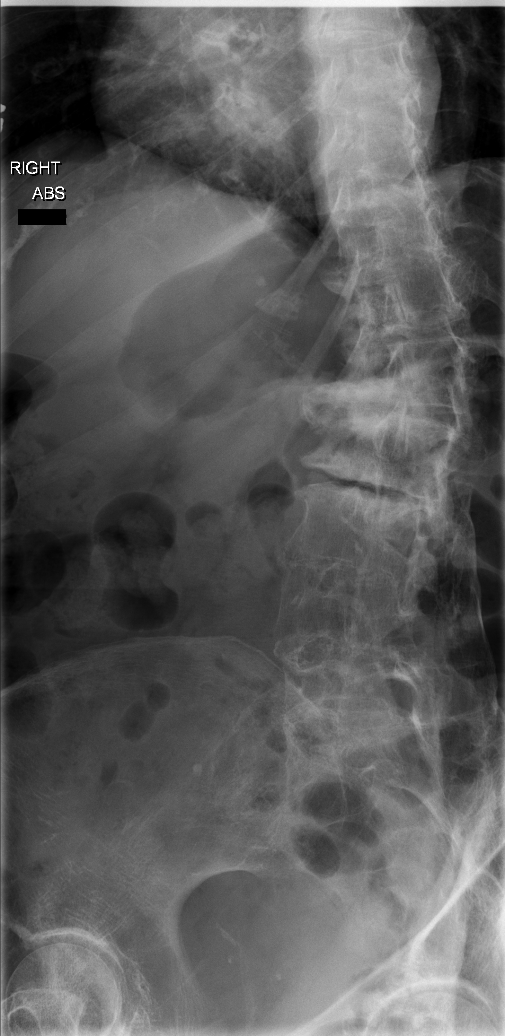

[8 of 8 positions shown; findings below may reference images not displayed]

FINDINGS: There is moderate levoscoliosis centered in the mid upper lumbar
spine. There is disc space narrowing at T12-L1, L1-L2, and L2-L3.
The patient has undergone previous lumbar fusion which appears to be
at L2-3 and L3-4. There is mild disc space narrowing at L4-5 and at
L1-2. There is no spondylolisthesis nor evidence of instability as
the patient moves from the flexed to the extended positions.
IMPRESSION: There are chronic post fusion changes in the mid lumbar spine. No
acute bony abnormality is demonstrated. No instability is
demonstrated as the patient moves from the flexed to the extended
positions.

## 2016-07-15 IMAGING — CR CERVICAL SPINE - COMPLETE 4+ VIEW
1 series · 6 of 6 positions shown · non-contrast
Comparison: None.

CLINICAL DATA: Chronic back pain without recent trauma

EXAM:
CERVICAL SPINE  4+ VIEWS

[Series 1: w cervical spine lat · 0.14mm/px · 6 of 6 slices shown]
[im 1/6]
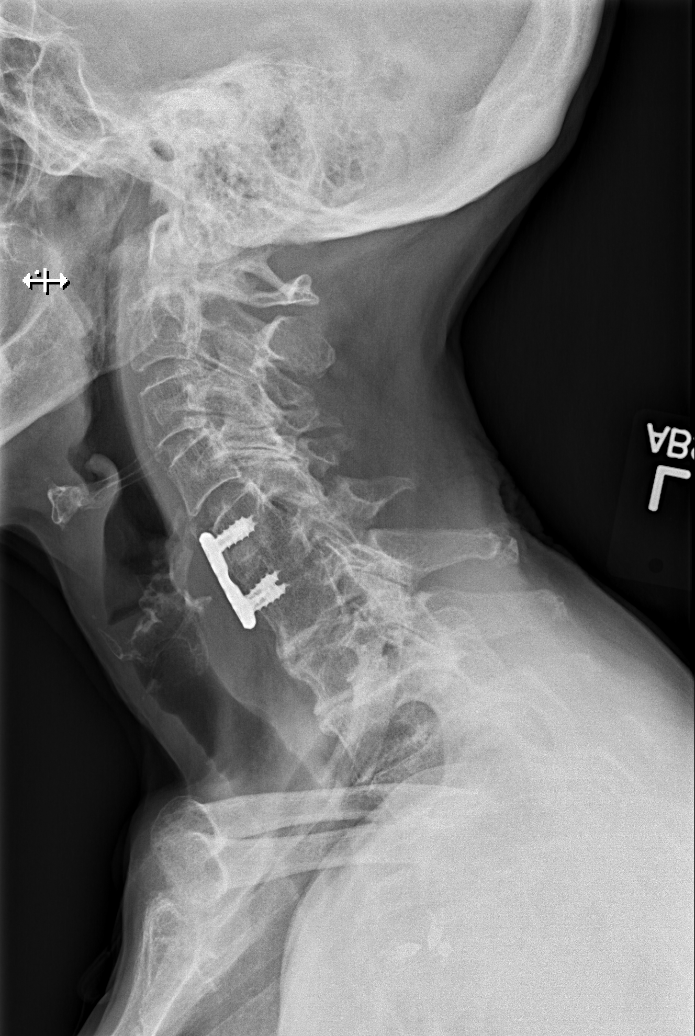
[im 2/6]
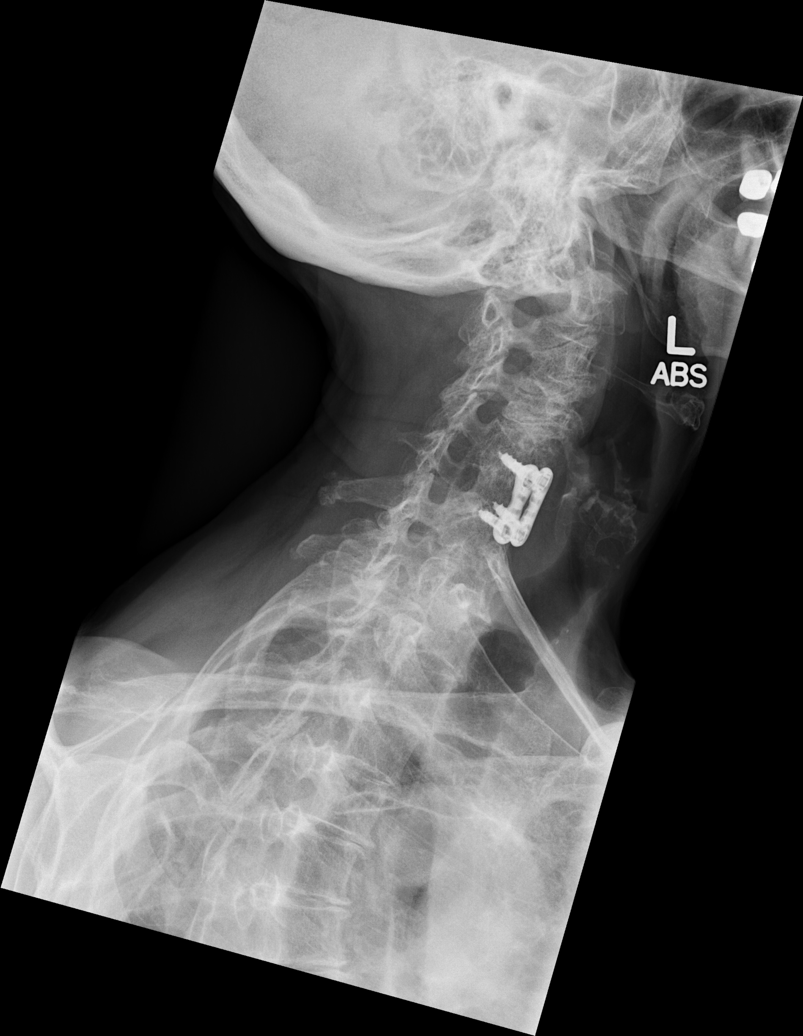
[im 3/6]
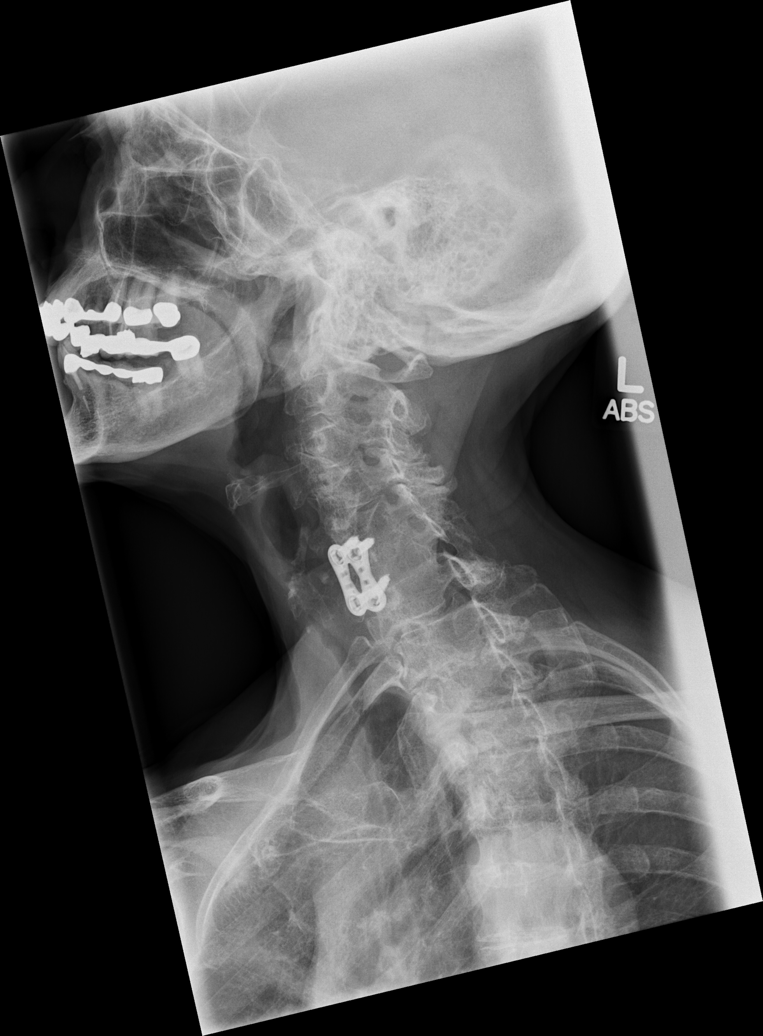
[im 4/6]
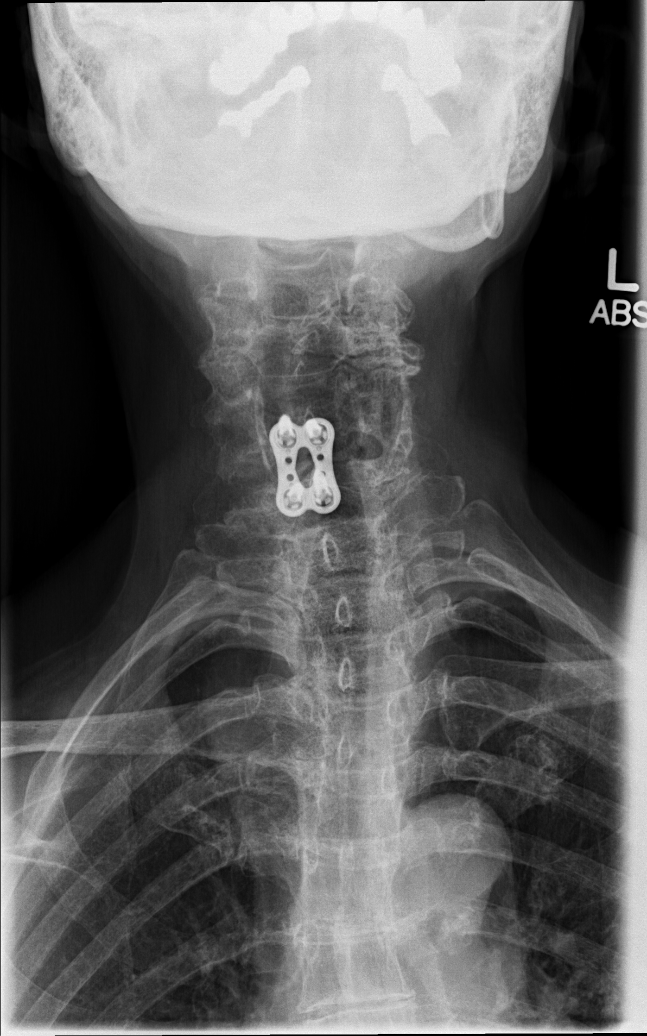
[im 5/6]
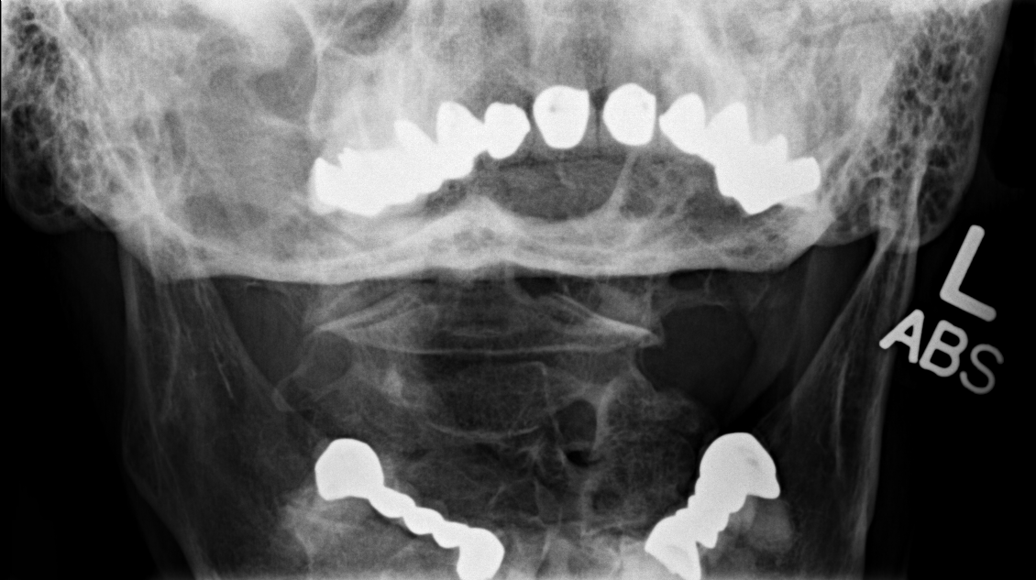
[im 6/6]
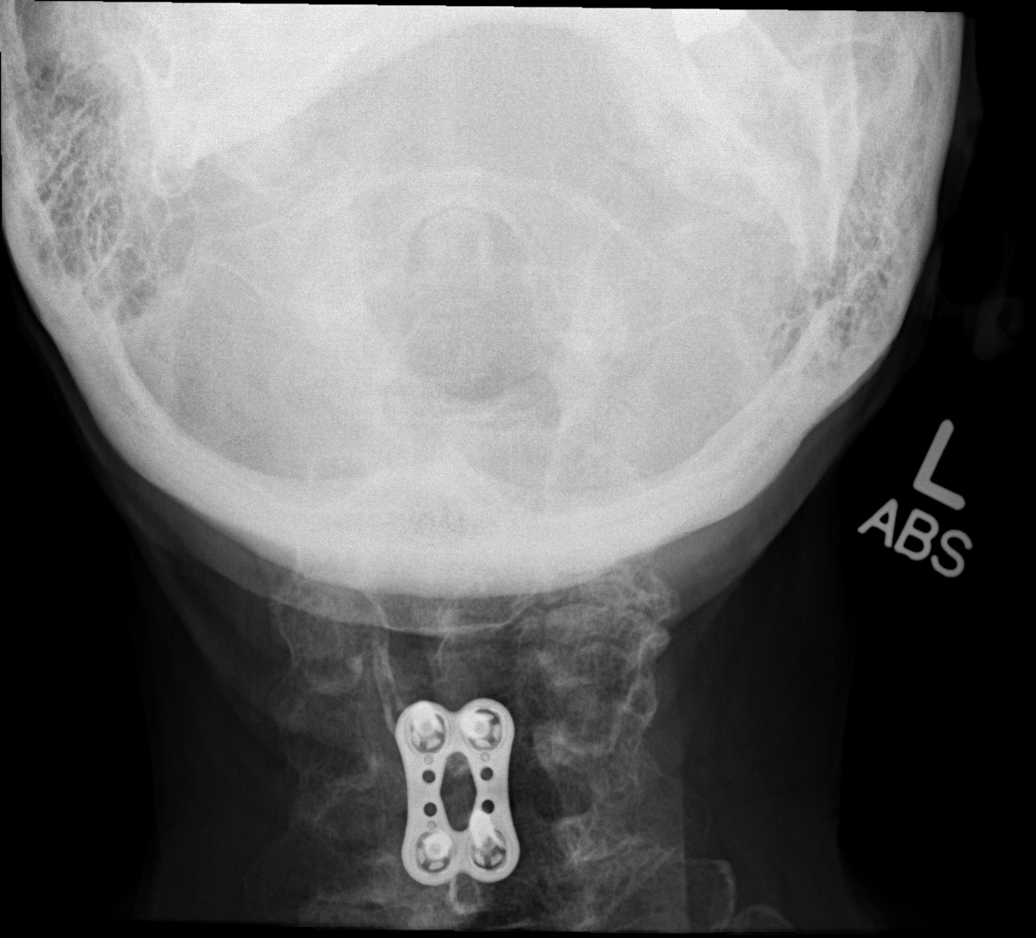

[6 of 6 positions shown; findings below may reference images not displayed]

FINDINGS: The patient has undergone previous anterior cervical fusion at C5-6.
The disc space is obliterated. Above this level the disc space
heights are well maintained. There is degenerative narrowing and
endplate sclerosis at C6-7, C7-T1, and T1-T2. The prevertebral soft
tissue spaces are normal. There is facet joint hypertrophy at
multiple levels. There is bony encroachment upon the neural foramina
on the left at multiple levels. The odontoid is intact where
visualized.
IMPRESSION: There is no acute bony abnormality of the cervical spine. There are
post fusion changes at C5-6, multilevel facet joint hypertrophy, and
degenerative disc change at C 6-7 and C7-T1 and T1-T2.

## 2016-09-20 IMAGING — US US EXTREM LOW VENOUS*L*
1 series · 13 of 24 positions shown · non-contrast
Comparison: None.

CLINICAL DATA: Left lower extremity pain and edema. History of left
knee surgery (07/17/2014) and smoking. Patient with history of
varicose vein surgery many years ago. Evaluate for DVT.



[Series 1: us extrem low venous*left* · 0.06mm/px · 13 of 30 slices shown]
[im 1/30]
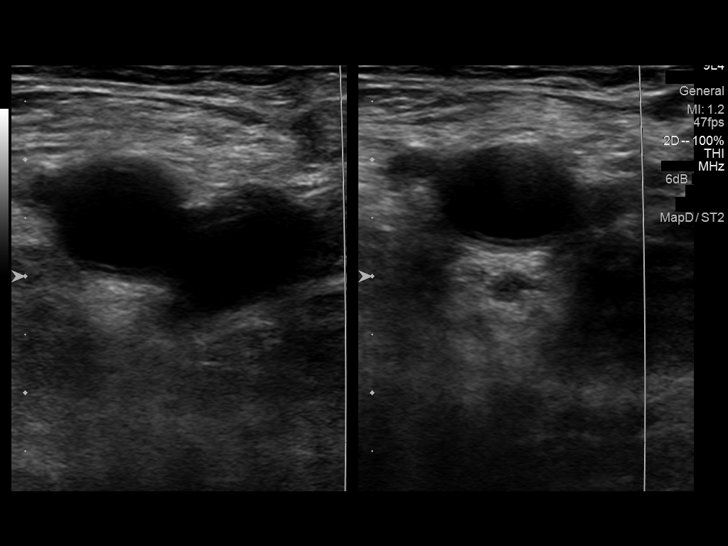
[im 3/30]
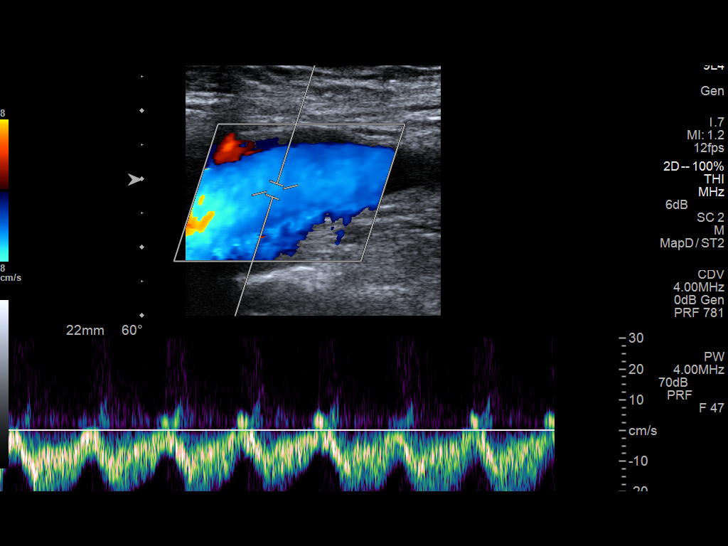
[im 6/30]
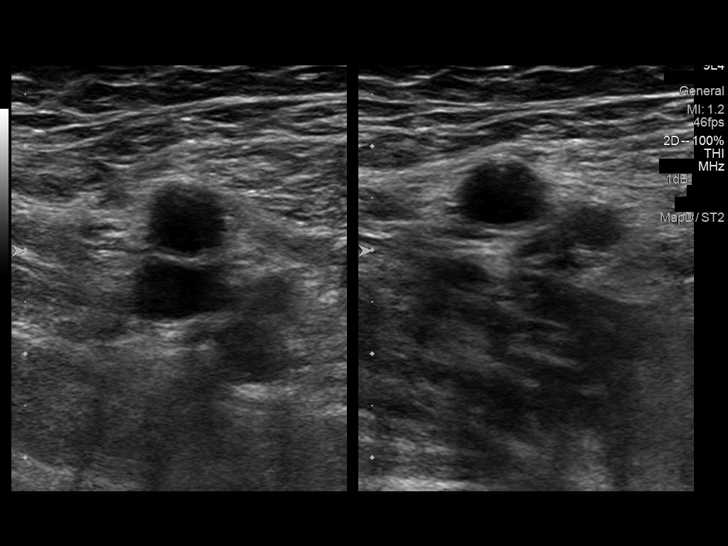
[im 8/30]
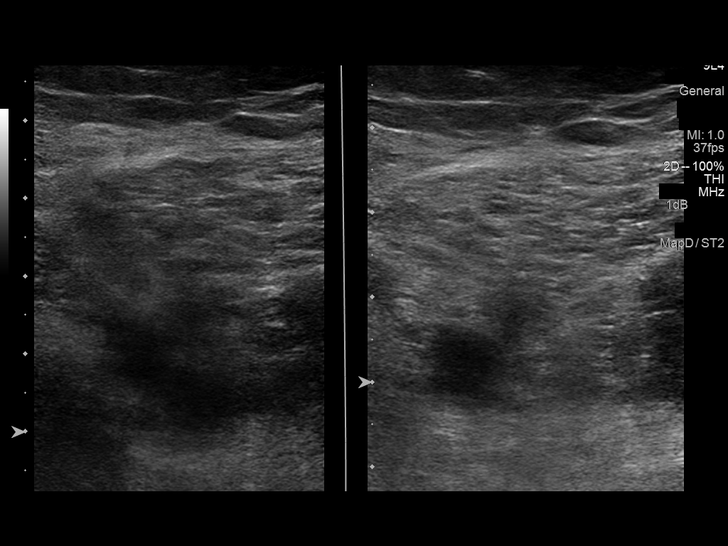
[im 11/30]
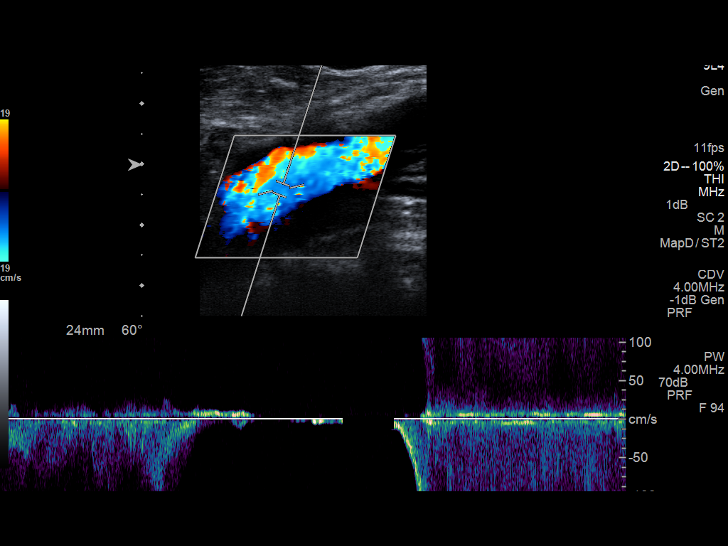
[im 13/30]
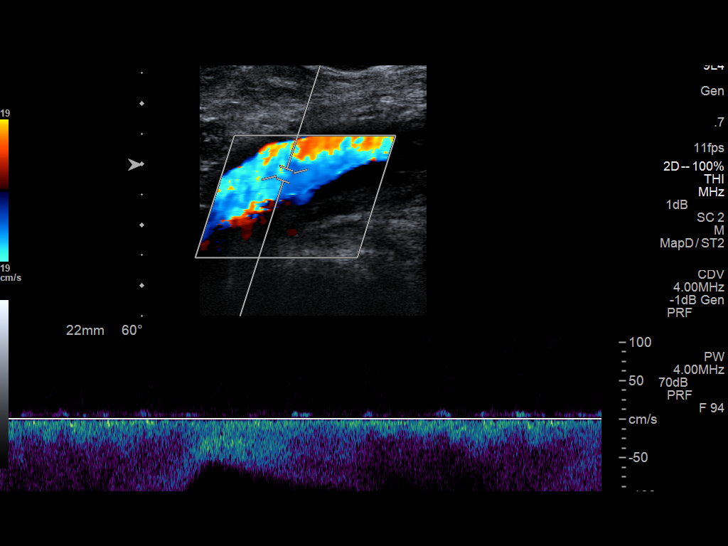
[im 16/30]
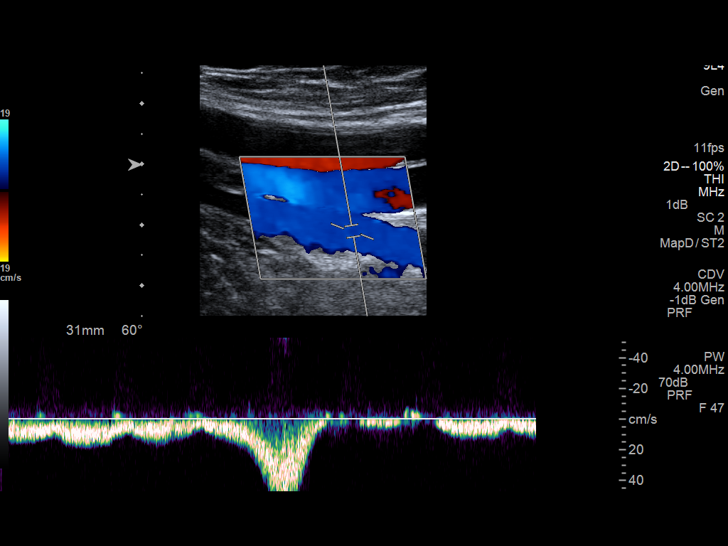
[im 17/30]
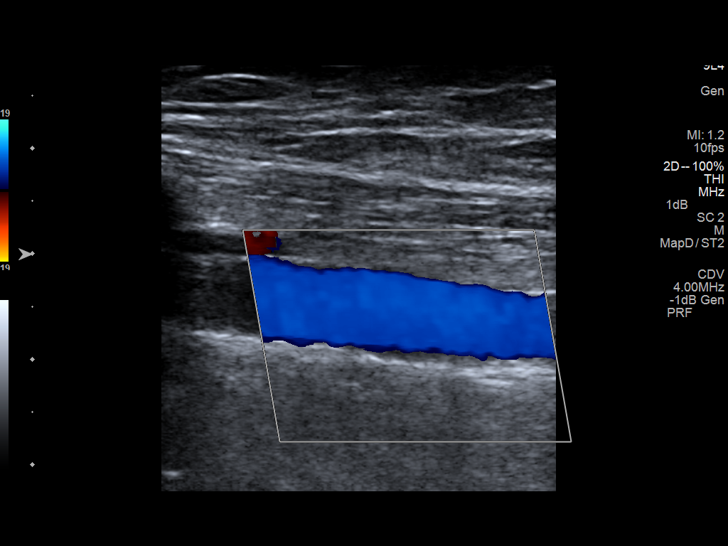
[im 19/30]
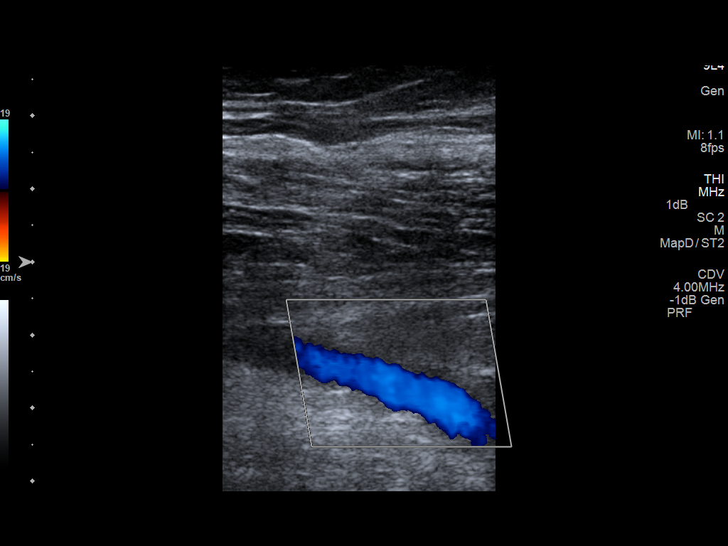
[im 22/30]
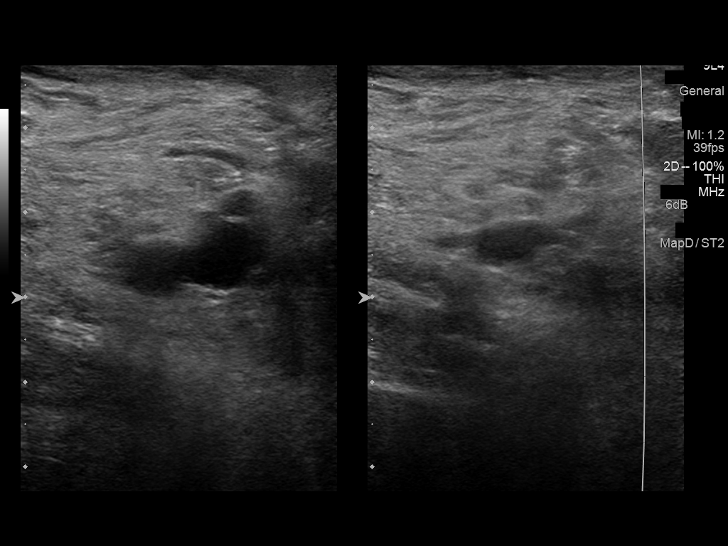
[im 24/30]
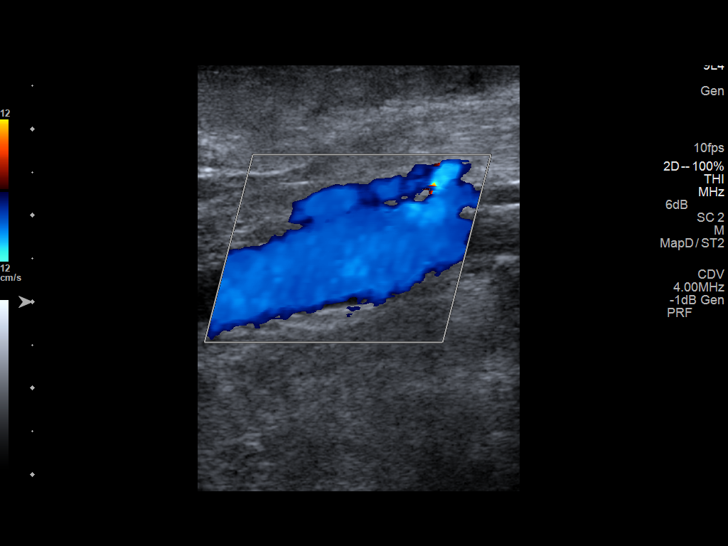
[im 27/30]
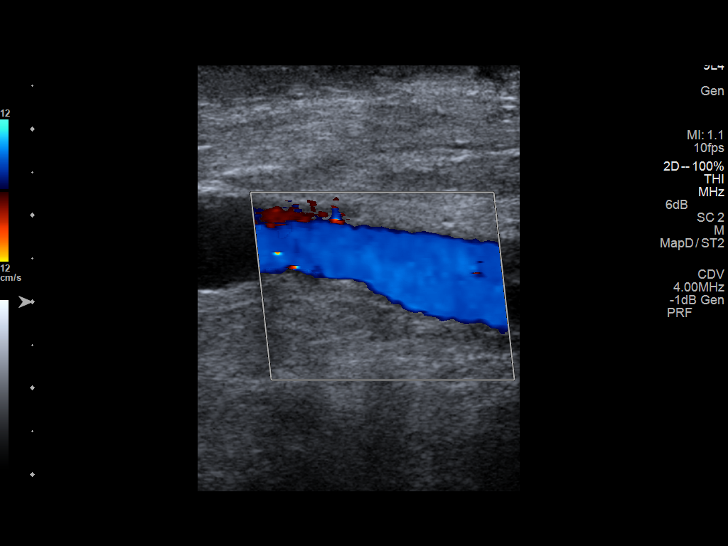
[im 30/30]
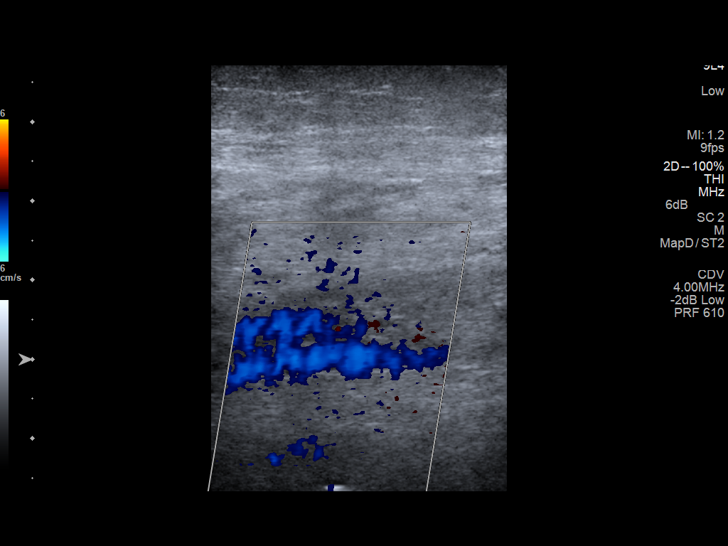

[13 of 24 positions shown; findings below may reference images not displayed]

FINDINGS: Contralateral Common Femoral Vein: Respiratory phasicity is normal
and symmetric with the symptomatic side. No evidence of thrombus.
Normal compressibility.

Common Femoral Vein: No evidence of thrombus. Normal
compressibility, respiratory phasicity and response to augmentation.

Saphenofemoral Junction: No evidence of thrombus. Normal
compressibility and flow on color Doppler imaging.

Profunda Femoral Vein: No evidence of thrombus. Normal
compressibility and flow on color Doppler imaging.

Femoral Vein: No evidence of thrombus. Normal compressibility,
respiratory phasicity and response to augmentation.

Popliteal Vein: No evidence of thrombus. Normal compressibility,
respiratory phasicity and response to augmentation.

Calf Veins: No evidence of thrombus. Normal compressibility and flow
on color Doppler imaging.

Superficial Great Saphenous Vein: No evidence of thrombus. Normal
compressibility and flow on color Doppler imaging.

Venous Reflux:  None.

Other Findings:  None.
IMPRESSION: No evidence of DVT within the left lower extremity.

## 2016-10-04 ENCOUNTER — Other Ambulatory Visit: Payer: Self-pay | Admitting: Internal Medicine

## 2016-10-04 DIAGNOSIS — G8929 Other chronic pain: Secondary | ICD-10-CM | POA: Diagnosis not present

## 2016-10-04 DIAGNOSIS — Z1239 Encounter for other screening for malignant neoplasm of breast: Secondary | ICD-10-CM

## 2016-10-04 DIAGNOSIS — I10 Essential (primary) hypertension: Secondary | ICD-10-CM | POA: Diagnosis not present

## 2016-10-04 DIAGNOSIS — M5441 Lumbago with sciatica, right side: Secondary | ICD-10-CM | POA: Diagnosis not present

## 2016-10-04 DIAGNOSIS — F325 Major depressive disorder, single episode, in full remission: Secondary | ICD-10-CM | POA: Diagnosis not present

## 2016-10-04 DIAGNOSIS — Z1231 Encounter for screening mammogram for malignant neoplasm of breast: Secondary | ICD-10-CM | POA: Diagnosis not present

## 2016-10-04 DIAGNOSIS — E039 Hypothyroidism, unspecified: Secondary | ICD-10-CM | POA: Diagnosis not present

## 2016-10-04 DIAGNOSIS — Z131 Encounter for screening for diabetes mellitus: Secondary | ICD-10-CM | POA: Diagnosis not present

## 2016-10-31 ENCOUNTER — Ambulatory Visit: Payer: Commercial Managed Care - HMO

## 2016-11-02 DIAGNOSIS — G8929 Other chronic pain: Secondary | ICD-10-CM | POA: Diagnosis not present

## 2016-11-02 DIAGNOSIS — M545 Low back pain: Secondary | ICD-10-CM | POA: Diagnosis not present

## 2016-11-02 DIAGNOSIS — M25561 Pain in right knee: Secondary | ICD-10-CM | POA: Diagnosis not present

## 2016-11-11 DIAGNOSIS — R8290 Unspecified abnormal findings in urine: Secondary | ICD-10-CM | POA: Diagnosis not present

## 2016-12-18 IMAGING — MG MAM DGTL SCRN MAM NO ORDER W/CAD
7 series · 7 of 7 positions shown · non-contrast
Comparison: None.

CLINICAL DATA: Screening.

EXAM:
DIGITAL SCREENING BILATERAL MAMMOGRAM WITH CAD

[R CC (1 of 2)]
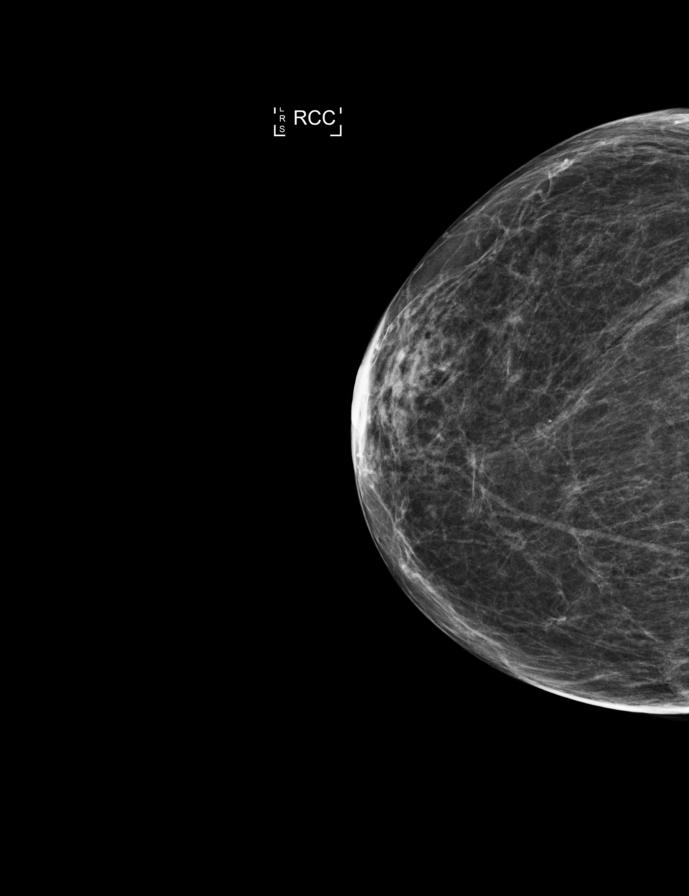

[R MLO]
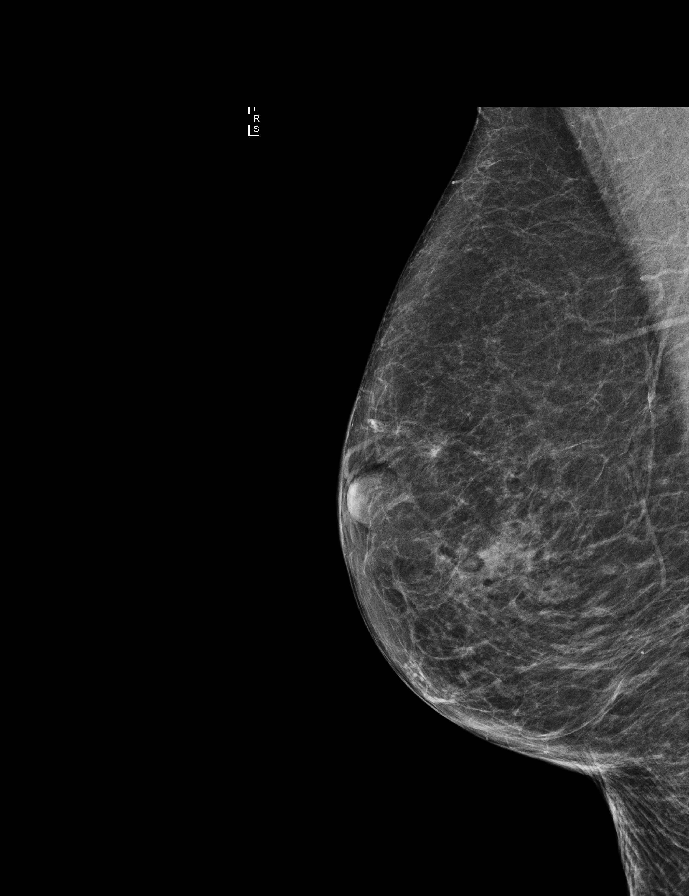

[L CC (1 of 2)]
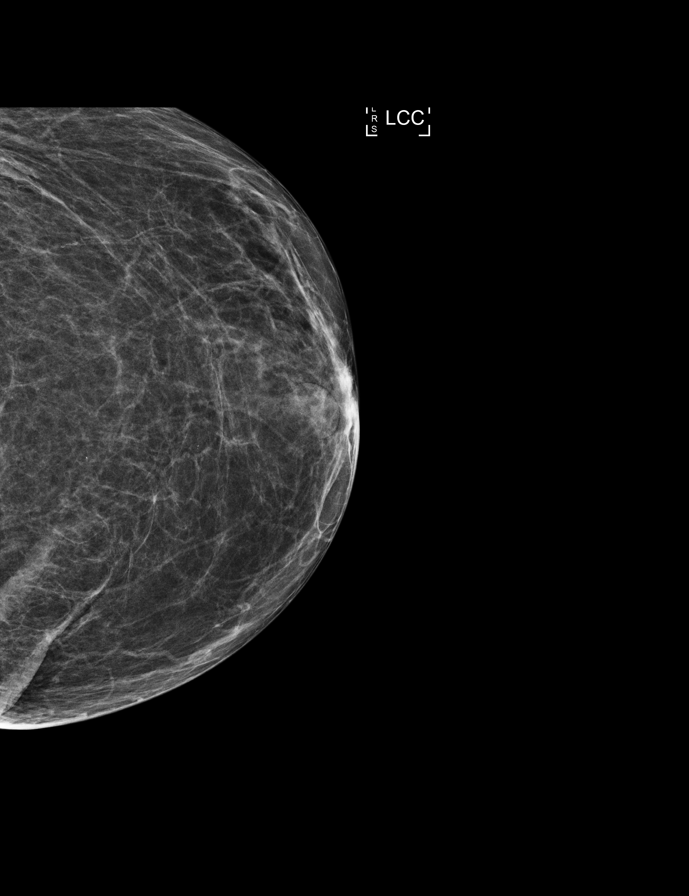

[L MLO (1 of 2)]
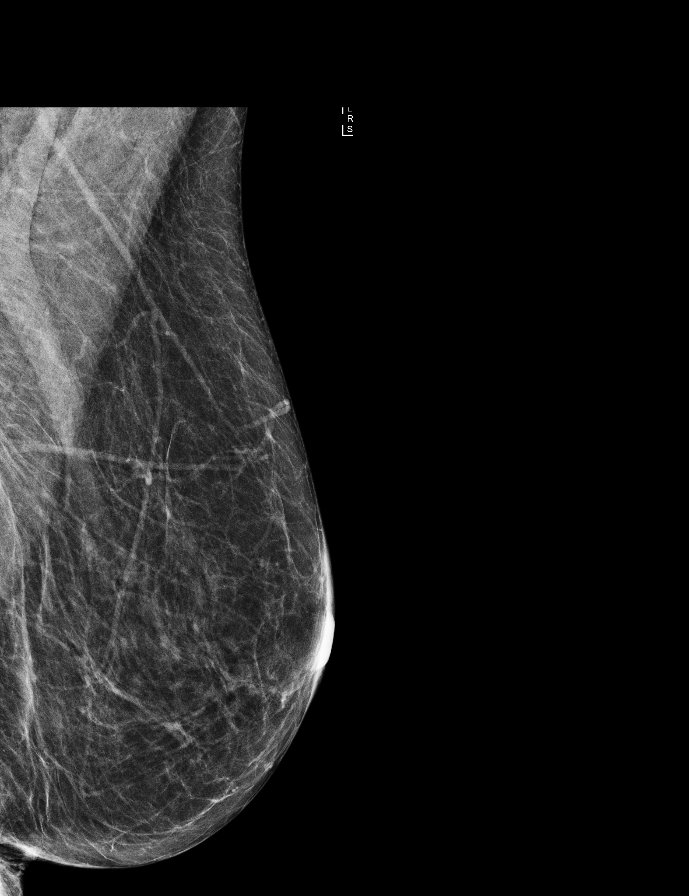

[L MLO (2 of 2)]
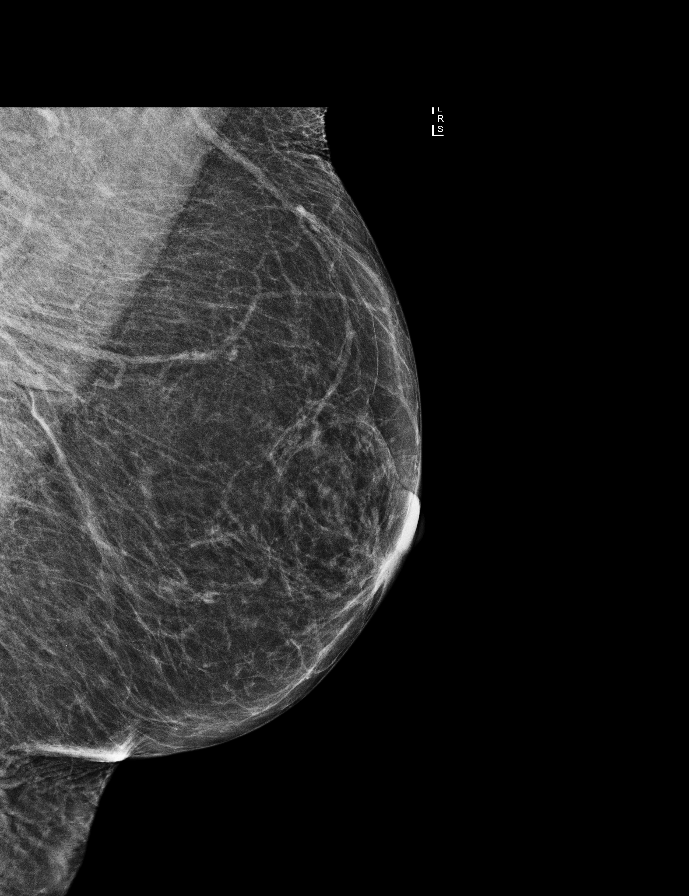

[L CC (2 of 2)]
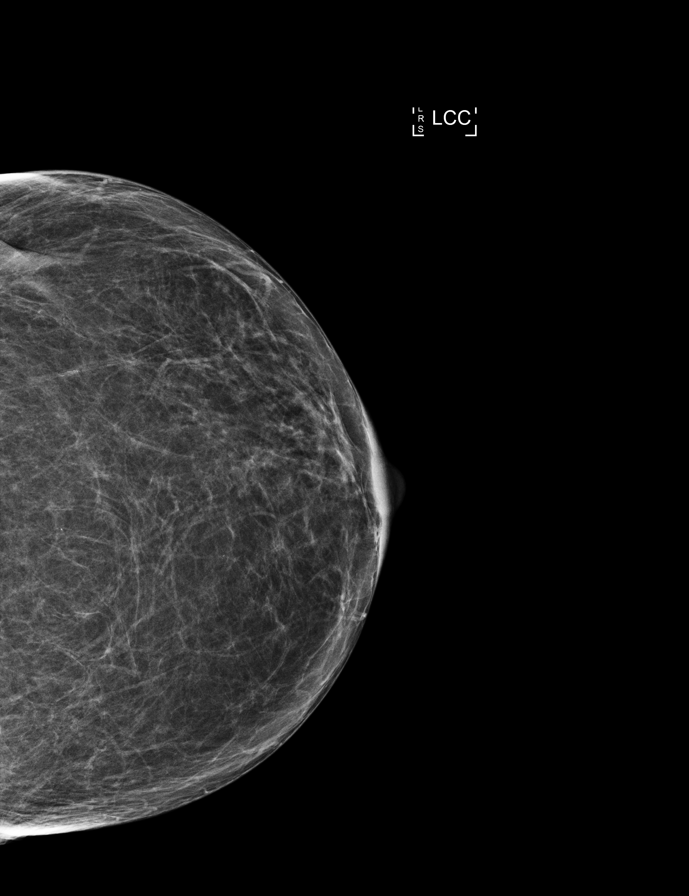

[R CC (2 of 2)]
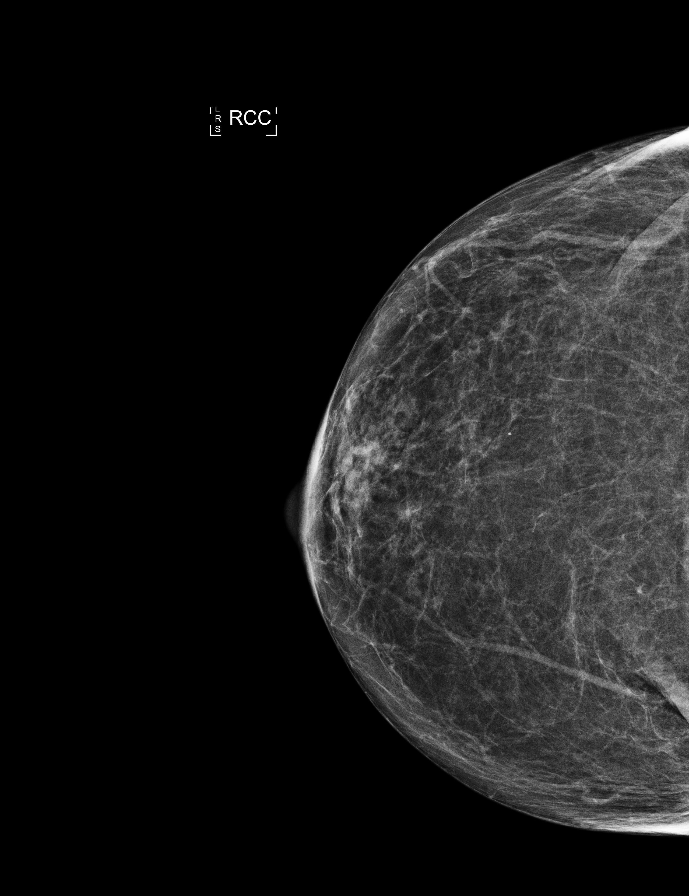

[7 of 7 positions shown; findings below may reference images not displayed]

ACR Breast Density Category b: There are scattered areas of
fibroglandular density.
FINDINGS: There are no findings suspicious for malignancy. Images were
processed with CAD.
IMPRESSION: No mammographic evidence of malignancy. A result letter of this
screening mammogram will be mailed directly to the patient.

RECOMMENDATION:
Screening mammogram in one year. (Code:[id number])

BI-RADS CATEGORY  1: Negative.

## 2017-01-04 DIAGNOSIS — E039 Hypothyroidism, unspecified: Secondary | ICD-10-CM | POA: Diagnosis not present

## 2017-01-04 DIAGNOSIS — M5441 Lumbago with sciatica, right side: Secondary | ICD-10-CM | POA: Diagnosis not present

## 2017-01-04 DIAGNOSIS — G8929 Other chronic pain: Secondary | ICD-10-CM | POA: Diagnosis not present

## 2017-01-04 DIAGNOSIS — F325 Major depressive disorder, single episode, in full remission: Secondary | ICD-10-CM | POA: Diagnosis not present

## 2017-01-04 DIAGNOSIS — I10 Essential (primary) hypertension: Secondary | ICD-10-CM | POA: Diagnosis not present

## 2017-03-30 DIAGNOSIS — E039 Hypothyroidism, unspecified: Secondary | ICD-10-CM | POA: Diagnosis not present

## 2017-03-30 DIAGNOSIS — Z131 Encounter for screening for diabetes mellitus: Secondary | ICD-10-CM | POA: Diagnosis not present

## 2017-03-30 DIAGNOSIS — Z139 Encounter for screening, unspecified: Secondary | ICD-10-CM | POA: Diagnosis not present

## 2017-03-30 DIAGNOSIS — I10 Essential (primary) hypertension: Secondary | ICD-10-CM | POA: Diagnosis not present

## 2017-04-04 DIAGNOSIS — I10 Essential (primary) hypertension: Secondary | ICD-10-CM | POA: Diagnosis not present

## 2017-04-04 DIAGNOSIS — F325 Major depressive disorder, single episode, in full remission: Secondary | ICD-10-CM | POA: Diagnosis not present

## 2017-04-04 DIAGNOSIS — M1711 Unilateral primary osteoarthritis, right knee: Secondary | ICD-10-CM | POA: Diagnosis not present

## 2017-04-04 DIAGNOSIS — E039 Hypothyroidism, unspecified: Secondary | ICD-10-CM | POA: Diagnosis not present

## 2017-04-06 DIAGNOSIS — Z0000 Encounter for general adult medical examination without abnormal findings: Secondary | ICD-10-CM | POA: Diagnosis not present

## 2017-04-06 DIAGNOSIS — Z780 Asymptomatic menopausal state: Secondary | ICD-10-CM | POA: Diagnosis not present

## 2017-04-06 DIAGNOSIS — Z1231 Encounter for screening mammogram for malignant neoplasm of breast: Secondary | ICD-10-CM | POA: Diagnosis not present

## 2017-04-06 DIAGNOSIS — F5104 Psychophysiologic insomnia: Secondary | ICD-10-CM | POA: Diagnosis not present

## 2017-04-06 DIAGNOSIS — F324 Major depressive disorder, single episode, in partial remission: Secondary | ICD-10-CM | POA: Diagnosis not present

## 2017-04-17 DIAGNOSIS — M1711 Unilateral primary osteoarthritis, right knee: Secondary | ICD-10-CM | POA: Insufficient documentation

## 2017-04-20 DIAGNOSIS — M810 Age-related osteoporosis without current pathological fracture: Secondary | ICD-10-CM | POA: Diagnosis not present

## 2017-05-03 DIAGNOSIS — G8929 Other chronic pain: Secondary | ICD-10-CM | POA: Diagnosis not present

## 2017-05-03 DIAGNOSIS — M5116 Intervertebral disc disorders with radiculopathy, lumbar region: Secondary | ICD-10-CM | POA: Diagnosis not present

## 2017-05-03 DIAGNOSIS — M5030 Other cervical disc degeneration, unspecified cervical region: Secondary | ICD-10-CM | POA: Diagnosis not present

## 2017-05-03 DIAGNOSIS — M25561 Pain in right knee: Secondary | ICD-10-CM | POA: Diagnosis not present

## 2017-05-03 DIAGNOSIS — M419 Scoliosis, unspecified: Secondary | ICD-10-CM | POA: Diagnosis not present

## 2017-05-03 DIAGNOSIS — M25562 Pain in left knee: Secondary | ICD-10-CM | POA: Diagnosis not present

## 2017-05-05 DIAGNOSIS — M5116 Intervertebral disc disorders with radiculopathy, lumbar region: Secondary | ICD-10-CM | POA: Diagnosis not present

## 2017-05-05 DIAGNOSIS — G8929 Other chronic pain: Secondary | ICD-10-CM | POA: Diagnosis not present

## 2017-05-05 DIAGNOSIS — M5030 Other cervical disc degeneration, unspecified cervical region: Secondary | ICD-10-CM | POA: Diagnosis not present

## 2017-05-05 DIAGNOSIS — M419 Scoliosis, unspecified: Secondary | ICD-10-CM | POA: Diagnosis not present

## 2017-05-05 DIAGNOSIS — M25561 Pain in right knee: Secondary | ICD-10-CM | POA: Diagnosis not present

## 2017-05-05 DIAGNOSIS — M25562 Pain in left knee: Secondary | ICD-10-CM | POA: Diagnosis not present

## 2017-05-08 DIAGNOSIS — M25561 Pain in right knee: Secondary | ICD-10-CM | POA: Diagnosis not present

## 2017-05-08 DIAGNOSIS — M419 Scoliosis, unspecified: Secondary | ICD-10-CM | POA: Diagnosis not present

## 2017-05-08 DIAGNOSIS — M5030 Other cervical disc degeneration, unspecified cervical region: Secondary | ICD-10-CM | POA: Diagnosis not present

## 2017-05-08 DIAGNOSIS — M5116 Intervertebral disc disorders with radiculopathy, lumbar region: Secondary | ICD-10-CM | POA: Diagnosis not present

## 2017-05-08 DIAGNOSIS — M25562 Pain in left knee: Secondary | ICD-10-CM | POA: Diagnosis not present

## 2017-05-08 DIAGNOSIS — G8929 Other chronic pain: Secondary | ICD-10-CM | POA: Diagnosis not present

## 2017-05-17 DIAGNOSIS — G8929 Other chronic pain: Secondary | ICD-10-CM | POA: Diagnosis not present

## 2017-05-17 DIAGNOSIS — M419 Scoliosis, unspecified: Secondary | ICD-10-CM | POA: Diagnosis not present

## 2017-05-17 DIAGNOSIS — F324 Major depressive disorder, single episode, in partial remission: Secondary | ICD-10-CM | POA: Diagnosis not present

## 2017-05-17 DIAGNOSIS — M5116 Intervertebral disc disorders with radiculopathy, lumbar region: Secondary | ICD-10-CM | POA: Diagnosis not present

## 2017-05-17 DIAGNOSIS — M5030 Other cervical disc degeneration, unspecified cervical region: Secondary | ICD-10-CM | POA: Diagnosis not present

## 2017-05-17 DIAGNOSIS — M25561 Pain in right knee: Secondary | ICD-10-CM | POA: Diagnosis not present

## 2017-05-17 DIAGNOSIS — M25562 Pain in left knee: Secondary | ICD-10-CM | POA: Diagnosis not present

## 2017-05-17 DIAGNOSIS — M810 Age-related osteoporosis without current pathological fracture: Secondary | ICD-10-CM | POA: Diagnosis not present

## 2017-05-19 DIAGNOSIS — G8929 Other chronic pain: Secondary | ICD-10-CM | POA: Diagnosis not present

## 2017-05-19 DIAGNOSIS — M419 Scoliosis, unspecified: Secondary | ICD-10-CM | POA: Diagnosis not present

## 2017-05-19 DIAGNOSIS — M5116 Intervertebral disc disorders with radiculopathy, lumbar region: Secondary | ICD-10-CM | POA: Diagnosis not present

## 2017-05-19 DIAGNOSIS — M25561 Pain in right knee: Secondary | ICD-10-CM | POA: Diagnosis not present

## 2017-05-19 DIAGNOSIS — M25562 Pain in left knee: Secondary | ICD-10-CM | POA: Diagnosis not present

## 2017-05-19 DIAGNOSIS — M5030 Other cervical disc degeneration, unspecified cervical region: Secondary | ICD-10-CM | POA: Diagnosis not present

## 2017-05-23 DIAGNOSIS — M5116 Intervertebral disc disorders with radiculopathy, lumbar region: Secondary | ICD-10-CM | POA: Diagnosis not present

## 2017-05-23 DIAGNOSIS — M25562 Pain in left knee: Secondary | ICD-10-CM | POA: Diagnosis not present

## 2017-05-23 DIAGNOSIS — M25561 Pain in right knee: Secondary | ICD-10-CM | POA: Diagnosis not present

## 2017-05-23 DIAGNOSIS — M419 Scoliosis, unspecified: Secondary | ICD-10-CM | POA: Diagnosis not present

## 2017-05-23 DIAGNOSIS — G8929 Other chronic pain: Secondary | ICD-10-CM | POA: Diagnosis not present

## 2017-05-23 DIAGNOSIS — M5030 Other cervical disc degeneration, unspecified cervical region: Secondary | ICD-10-CM | POA: Diagnosis not present

## 2017-05-25 DIAGNOSIS — M5116 Intervertebral disc disorders with radiculopathy, lumbar region: Secondary | ICD-10-CM | POA: Diagnosis not present

## 2017-05-25 DIAGNOSIS — M25561 Pain in right knee: Secondary | ICD-10-CM | POA: Diagnosis not present

## 2017-05-25 DIAGNOSIS — M419 Scoliosis, unspecified: Secondary | ICD-10-CM | POA: Diagnosis not present

## 2017-05-25 DIAGNOSIS — M5030 Other cervical disc degeneration, unspecified cervical region: Secondary | ICD-10-CM | POA: Diagnosis not present

## 2017-05-25 DIAGNOSIS — M25562 Pain in left knee: Secondary | ICD-10-CM | POA: Diagnosis not present

## 2017-05-25 DIAGNOSIS — G8929 Other chronic pain: Secondary | ICD-10-CM | POA: Diagnosis not present

## 2017-05-30 DIAGNOSIS — M25561 Pain in right knee: Secondary | ICD-10-CM | POA: Diagnosis not present

## 2017-05-30 DIAGNOSIS — M25562 Pain in left knee: Secondary | ICD-10-CM | POA: Diagnosis not present

## 2017-05-30 DIAGNOSIS — M419 Scoliosis, unspecified: Secondary | ICD-10-CM | POA: Diagnosis not present

## 2017-05-30 DIAGNOSIS — M5116 Intervertebral disc disorders with radiculopathy, lumbar region: Secondary | ICD-10-CM | POA: Diagnosis not present

## 2017-05-30 DIAGNOSIS — M5030 Other cervical disc degeneration, unspecified cervical region: Secondary | ICD-10-CM | POA: Diagnosis not present

## 2017-05-30 DIAGNOSIS — G8929 Other chronic pain: Secondary | ICD-10-CM | POA: Diagnosis not present

## 2017-06-01 DIAGNOSIS — M25561 Pain in right knee: Secondary | ICD-10-CM | POA: Diagnosis not present

## 2017-06-01 DIAGNOSIS — M25562 Pain in left knee: Secondary | ICD-10-CM | POA: Diagnosis not present

## 2017-06-01 DIAGNOSIS — M5116 Intervertebral disc disorders with radiculopathy, lumbar region: Secondary | ICD-10-CM | POA: Diagnosis not present

## 2017-06-01 DIAGNOSIS — G8929 Other chronic pain: Secondary | ICD-10-CM | POA: Diagnosis not present

## 2017-06-01 DIAGNOSIS — M5030 Other cervical disc degeneration, unspecified cervical region: Secondary | ICD-10-CM | POA: Diagnosis not present

## 2017-06-01 DIAGNOSIS — M419 Scoliosis, unspecified: Secondary | ICD-10-CM | POA: Diagnosis not present

## 2017-06-06 DIAGNOSIS — M5030 Other cervical disc degeneration, unspecified cervical region: Secondary | ICD-10-CM | POA: Diagnosis not present

## 2017-06-06 DIAGNOSIS — M5116 Intervertebral disc disorders with radiculopathy, lumbar region: Secondary | ICD-10-CM | POA: Diagnosis not present

## 2017-06-06 DIAGNOSIS — G8929 Other chronic pain: Secondary | ICD-10-CM | POA: Diagnosis not present

## 2017-06-06 DIAGNOSIS — M25561 Pain in right knee: Secondary | ICD-10-CM | POA: Diagnosis not present

## 2017-06-06 DIAGNOSIS — M419 Scoliosis, unspecified: Secondary | ICD-10-CM | POA: Diagnosis not present

## 2017-06-06 DIAGNOSIS — M25562 Pain in left knee: Secondary | ICD-10-CM | POA: Diagnosis not present

## 2017-06-09 DIAGNOSIS — M5116 Intervertebral disc disorders with radiculopathy, lumbar region: Secondary | ICD-10-CM | POA: Diagnosis not present

## 2017-06-09 DIAGNOSIS — G8929 Other chronic pain: Secondary | ICD-10-CM | POA: Diagnosis not present

## 2017-06-09 DIAGNOSIS — M25561 Pain in right knee: Secondary | ICD-10-CM | POA: Diagnosis not present

## 2017-06-09 DIAGNOSIS — M5030 Other cervical disc degeneration, unspecified cervical region: Secondary | ICD-10-CM | POA: Diagnosis not present

## 2017-06-09 DIAGNOSIS — M419 Scoliosis, unspecified: Secondary | ICD-10-CM | POA: Diagnosis not present

## 2017-06-09 DIAGNOSIS — M25562 Pain in left knee: Secondary | ICD-10-CM | POA: Diagnosis not present

## 2017-07-12 DIAGNOSIS — I10 Essential (primary) hypertension: Secondary | ICD-10-CM | POA: Diagnosis not present

## 2017-07-12 DIAGNOSIS — E039 Hypothyroidism, unspecified: Secondary | ICD-10-CM | POA: Diagnosis not present

## 2017-07-12 DIAGNOSIS — N183 Chronic kidney disease, stage 3 unspecified: Secondary | ICD-10-CM | POA: Insufficient documentation

## 2017-07-12 DIAGNOSIS — F325 Major depressive disorder, single episode, in full remission: Secondary | ICD-10-CM | POA: Diagnosis not present

## 2017-10-09 DIAGNOSIS — J069 Acute upper respiratory infection, unspecified: Secondary | ICD-10-CM | POA: Diagnosis not present

## 2017-10-25 DIAGNOSIS — I10 Essential (primary) hypertension: Secondary | ICD-10-CM | POA: Diagnosis not present

## 2017-10-25 DIAGNOSIS — M542 Cervicalgia: Secondary | ICD-10-CM | POA: Diagnosis not present

## 2017-10-25 DIAGNOSIS — M4322 Fusion of spine, cervical region: Secondary | ICD-10-CM | POA: Diagnosis not present

## 2017-10-25 DIAGNOSIS — E039 Hypothyroidism, unspecified: Secondary | ICD-10-CM | POA: Diagnosis not present

## 2017-10-25 DIAGNOSIS — N289 Disorder of kidney and ureter, unspecified: Secondary | ICD-10-CM | POA: Diagnosis not present

## 2017-10-25 DIAGNOSIS — G8929 Other chronic pain: Secondary | ICD-10-CM | POA: Diagnosis not present

## 2017-11-01 DIAGNOSIS — R911 Solitary pulmonary nodule: Secondary | ICD-10-CM | POA: Diagnosis not present

## 2017-11-02 DIAGNOSIS — R918 Other nonspecific abnormal finding of lung field: Secondary | ICD-10-CM | POA: Diagnosis not present

## 2017-11-03 ENCOUNTER — Other Ambulatory Visit: Payer: Self-pay | Admitting: Internal Medicine

## 2017-11-03 DIAGNOSIS — R9389 Abnormal findings on diagnostic imaging of other specified body structures: Secondary | ICD-10-CM

## 2017-11-07 ENCOUNTER — Ambulatory Visit
Admission: RE | Admit: 2017-11-07 | Discharge: 2017-11-07 | Disposition: A | Discharge status: Home / Self Care | Payer: Medicare HMO | Source: Ambulatory Visit | Attending: Internal Medicine | Admitting: Internal Medicine

## 2017-11-07 DIAGNOSIS — R0981 Nasal congestion: Secondary | ICD-10-CM | POA: Diagnosis not present

## 2017-11-07 DIAGNOSIS — I10 Essential (primary) hypertension: Secondary | ICD-10-CM | POA: Diagnosis not present

## 2017-11-07 DIAGNOSIS — I2510 Atherosclerotic heart disease of native coronary artery without angina pectoris: Secondary | ICD-10-CM | POA: Diagnosis not present

## 2017-11-07 DIAGNOSIS — J984 Other disorders of lung: Secondary | ICD-10-CM | POA: Insufficient documentation

## 2017-11-07 DIAGNOSIS — I517 Cardiomegaly: Secondary | ICD-10-CM | POA: Insufficient documentation

## 2017-11-07 DIAGNOSIS — R9389 Abnormal findings on diagnostic imaging of other specified body structures: Secondary | ICD-10-CM

## 2017-11-07 LAB — POCT I-STAT CREATININE: CREATININE: 1 mg/dL (ref 0.44–1.00)

## 2017-11-07 MED ORDER — IOPAMIDOL (ISOVUE-300) INJECTION 61%
75.0000 mL | Freq: Once | INTRAVENOUS | Status: AC | PRN
  Administered 2017-11-07: 75 mL via INTRAVENOUS

## 2017-11-15 DIAGNOSIS — N183 Chronic kidney disease, stage 3 (moderate): Secondary | ICD-10-CM | POA: Diagnosis not present

## 2017-11-15 DIAGNOSIS — M5412 Radiculopathy, cervical region: Secondary | ICD-10-CM | POA: Diagnosis not present

## 2017-11-15 DIAGNOSIS — I10 Essential (primary) hypertension: Secondary | ICD-10-CM | POA: Diagnosis not present

## 2017-11-29 DIAGNOSIS — M5412 Radiculopathy, cervical region: Secondary | ICD-10-CM | POA: Diagnosis not present

## 2017-11-29 DIAGNOSIS — M5030 Other cervical disc degeneration, unspecified cervical region: Secondary | ICD-10-CM | POA: Diagnosis not present

## 2017-12-05 DIAGNOSIS — M810 Age-related osteoporosis without current pathological fracture: Secondary | ICD-10-CM | POA: Diagnosis not present

## 2017-12-05 DIAGNOSIS — M5010 Cervical disc disorder with radiculopathy, unspecified cervical region: Secondary | ICD-10-CM | POA: Diagnosis not present

## 2017-12-05 DIAGNOSIS — I129 Hypertensive chronic kidney disease with stage 1 through stage 4 chronic kidney disease, or unspecified chronic kidney disease: Secondary | ICD-10-CM | POA: Diagnosis not present

## 2017-12-05 DIAGNOSIS — G894 Chronic pain syndrome: Secondary | ICD-10-CM | POA: Diagnosis not present

## 2017-12-05 DIAGNOSIS — M1991 Primary osteoarthritis, unspecified site: Secondary | ICD-10-CM | POA: Diagnosis not present

## 2017-12-05 DIAGNOSIS — M419 Scoliosis, unspecified: Secondary | ICD-10-CM | POA: Diagnosis not present

## 2017-12-07 DIAGNOSIS — M419 Scoliosis, unspecified: Secondary | ICD-10-CM | POA: Diagnosis not present

## 2017-12-07 DIAGNOSIS — G894 Chronic pain syndrome: Secondary | ICD-10-CM | POA: Diagnosis not present

## 2017-12-07 DIAGNOSIS — I129 Hypertensive chronic kidney disease with stage 1 through stage 4 chronic kidney disease, or unspecified chronic kidney disease: Secondary | ICD-10-CM | POA: Diagnosis not present

## 2017-12-07 DIAGNOSIS — M810 Age-related osteoporosis without current pathological fracture: Secondary | ICD-10-CM | POA: Diagnosis not present

## 2017-12-07 DIAGNOSIS — M1991 Primary osteoarthritis, unspecified site: Secondary | ICD-10-CM | POA: Diagnosis not present

## 2017-12-07 DIAGNOSIS — M5010 Cervical disc disorder with radiculopathy, unspecified cervical region: Secondary | ICD-10-CM | POA: Diagnosis not present

## 2017-12-11 DIAGNOSIS — M1991 Primary osteoarthritis, unspecified site: Secondary | ICD-10-CM | POA: Diagnosis not present

## 2017-12-11 DIAGNOSIS — I129 Hypertensive chronic kidney disease with stage 1 through stage 4 chronic kidney disease, or unspecified chronic kidney disease: Secondary | ICD-10-CM | POA: Diagnosis not present

## 2017-12-11 DIAGNOSIS — M419 Scoliosis, unspecified: Secondary | ICD-10-CM | POA: Diagnosis not present

## 2017-12-11 DIAGNOSIS — G894 Chronic pain syndrome: Secondary | ICD-10-CM | POA: Diagnosis not present

## 2017-12-11 DIAGNOSIS — M5010 Cervical disc disorder with radiculopathy, unspecified cervical region: Secondary | ICD-10-CM | POA: Diagnosis not present

## 2017-12-11 DIAGNOSIS — M810 Age-related osteoporosis without current pathological fracture: Secondary | ICD-10-CM | POA: Diagnosis not present

## 2017-12-13 DIAGNOSIS — M419 Scoliosis, unspecified: Secondary | ICD-10-CM | POA: Diagnosis not present

## 2017-12-13 DIAGNOSIS — M5010 Cervical disc disorder with radiculopathy, unspecified cervical region: Secondary | ICD-10-CM | POA: Diagnosis not present

## 2017-12-13 DIAGNOSIS — G894 Chronic pain syndrome: Secondary | ICD-10-CM | POA: Diagnosis not present

## 2017-12-13 DIAGNOSIS — M810 Age-related osteoporosis without current pathological fracture: Secondary | ICD-10-CM | POA: Diagnosis not present

## 2017-12-13 DIAGNOSIS — M1991 Primary osteoarthritis, unspecified site: Secondary | ICD-10-CM | POA: Diagnosis not present

## 2017-12-13 DIAGNOSIS — I129 Hypertensive chronic kidney disease with stage 1 through stage 4 chronic kidney disease, or unspecified chronic kidney disease: Secondary | ICD-10-CM | POA: Diagnosis not present

## 2017-12-19 DIAGNOSIS — M419 Scoliosis, unspecified: Secondary | ICD-10-CM | POA: Diagnosis not present

## 2017-12-19 DIAGNOSIS — M1991 Primary osteoarthritis, unspecified site: Secondary | ICD-10-CM | POA: Diagnosis not present

## 2017-12-19 DIAGNOSIS — I129 Hypertensive chronic kidney disease with stage 1 through stage 4 chronic kidney disease, or unspecified chronic kidney disease: Secondary | ICD-10-CM | POA: Diagnosis not present

## 2017-12-19 DIAGNOSIS — M810 Age-related osteoporosis without current pathological fracture: Secondary | ICD-10-CM | POA: Diagnosis not present

## 2017-12-19 DIAGNOSIS — G894 Chronic pain syndrome: Secondary | ICD-10-CM | POA: Diagnosis not present

## 2017-12-19 DIAGNOSIS — F329 Major depressive disorder, single episode, unspecified: Secondary | ICD-10-CM | POA: Diagnosis not present

## 2017-12-19 DIAGNOSIS — N183 Chronic kidney disease, stage 3 (moderate): Secondary | ICD-10-CM | POA: Diagnosis not present

## 2017-12-19 DIAGNOSIS — Z9181 History of falling: Secondary | ICD-10-CM | POA: Diagnosis not present

## 2017-12-19 DIAGNOSIS — M5010 Cervical disc disorder with radiculopathy, unspecified cervical region: Secondary | ICD-10-CM | POA: Diagnosis not present

## 2017-12-21 DIAGNOSIS — G894 Chronic pain syndrome: Secondary | ICD-10-CM | POA: Diagnosis not present

## 2017-12-21 DIAGNOSIS — M1991 Primary osteoarthritis, unspecified site: Secondary | ICD-10-CM | POA: Diagnosis not present

## 2017-12-21 DIAGNOSIS — M419 Scoliosis, unspecified: Secondary | ICD-10-CM | POA: Diagnosis not present

## 2017-12-21 DIAGNOSIS — I129 Hypertensive chronic kidney disease with stage 1 through stage 4 chronic kidney disease, or unspecified chronic kidney disease: Secondary | ICD-10-CM | POA: Diagnosis not present

## 2017-12-21 DIAGNOSIS — M810 Age-related osteoporosis without current pathological fracture: Secondary | ICD-10-CM | POA: Diagnosis not present

## 2017-12-21 DIAGNOSIS — M5010 Cervical disc disorder with radiculopathy, unspecified cervical region: Secondary | ICD-10-CM | POA: Diagnosis not present

## 2017-12-26 DIAGNOSIS — I129 Hypertensive chronic kidney disease with stage 1 through stage 4 chronic kidney disease, or unspecified chronic kidney disease: Secondary | ICD-10-CM | POA: Diagnosis not present

## 2017-12-26 DIAGNOSIS — M1991 Primary osteoarthritis, unspecified site: Secondary | ICD-10-CM | POA: Diagnosis not present

## 2017-12-26 DIAGNOSIS — M419 Scoliosis, unspecified: Secondary | ICD-10-CM | POA: Diagnosis not present

## 2017-12-26 DIAGNOSIS — M810 Age-related osteoporosis without current pathological fracture: Secondary | ICD-10-CM | POA: Diagnosis not present

## 2017-12-26 DIAGNOSIS — M5010 Cervical disc disorder with radiculopathy, unspecified cervical region: Secondary | ICD-10-CM | POA: Diagnosis not present

## 2017-12-26 DIAGNOSIS — G894 Chronic pain syndrome: Secondary | ICD-10-CM | POA: Diagnosis not present

## 2017-12-28 DIAGNOSIS — I129 Hypertensive chronic kidney disease with stage 1 through stage 4 chronic kidney disease, or unspecified chronic kidney disease: Secondary | ICD-10-CM | POA: Diagnosis not present

## 2017-12-28 DIAGNOSIS — M810 Age-related osteoporosis without current pathological fracture: Secondary | ICD-10-CM | POA: Diagnosis not present

## 2017-12-28 DIAGNOSIS — M419 Scoliosis, unspecified: Secondary | ICD-10-CM | POA: Diagnosis not present

## 2017-12-28 DIAGNOSIS — M5010 Cervical disc disorder with radiculopathy, unspecified cervical region: Secondary | ICD-10-CM | POA: Diagnosis not present

## 2017-12-28 DIAGNOSIS — M1991 Primary osteoarthritis, unspecified site: Secondary | ICD-10-CM | POA: Diagnosis not present

## 2017-12-28 DIAGNOSIS — G894 Chronic pain syndrome: Secondary | ICD-10-CM | POA: Diagnosis not present

## 2018-01-02 DIAGNOSIS — G894 Chronic pain syndrome: Secondary | ICD-10-CM | POA: Diagnosis not present

## 2018-01-02 DIAGNOSIS — M5010 Cervical disc disorder with radiculopathy, unspecified cervical region: Secondary | ICD-10-CM | POA: Diagnosis not present

## 2018-01-02 DIAGNOSIS — M1991 Primary osteoarthritis, unspecified site: Secondary | ICD-10-CM | POA: Diagnosis not present

## 2018-01-02 DIAGNOSIS — M419 Scoliosis, unspecified: Secondary | ICD-10-CM | POA: Diagnosis not present

## 2018-01-02 DIAGNOSIS — M810 Age-related osteoporosis without current pathological fracture: Secondary | ICD-10-CM | POA: Diagnosis not present

## 2018-01-02 DIAGNOSIS — I129 Hypertensive chronic kidney disease with stage 1 through stage 4 chronic kidney disease, or unspecified chronic kidney disease: Secondary | ICD-10-CM | POA: Diagnosis not present

## 2018-01-04 DIAGNOSIS — G894 Chronic pain syndrome: Secondary | ICD-10-CM | POA: Diagnosis not present

## 2018-01-04 DIAGNOSIS — M5010 Cervical disc disorder with radiculopathy, unspecified cervical region: Secondary | ICD-10-CM | POA: Diagnosis not present

## 2018-01-04 DIAGNOSIS — M1991 Primary osteoarthritis, unspecified site: Secondary | ICD-10-CM | POA: Diagnosis not present

## 2018-01-04 DIAGNOSIS — M810 Age-related osteoporosis without current pathological fracture: Secondary | ICD-10-CM | POA: Diagnosis not present

## 2018-01-04 DIAGNOSIS — I129 Hypertensive chronic kidney disease with stage 1 through stage 4 chronic kidney disease, or unspecified chronic kidney disease: Secondary | ICD-10-CM | POA: Diagnosis not present

## 2018-01-04 DIAGNOSIS — M419 Scoliosis, unspecified: Secondary | ICD-10-CM | POA: Diagnosis not present

## 2018-01-08 DIAGNOSIS — M5010 Cervical disc disorder with radiculopathy, unspecified cervical region: Secondary | ICD-10-CM | POA: Diagnosis not present

## 2018-01-08 DIAGNOSIS — M419 Scoliosis, unspecified: Secondary | ICD-10-CM | POA: Diagnosis not present

## 2018-01-08 DIAGNOSIS — M1991 Primary osteoarthritis, unspecified site: Secondary | ICD-10-CM | POA: Diagnosis not present

## 2018-01-08 DIAGNOSIS — M810 Age-related osteoporosis without current pathological fracture: Secondary | ICD-10-CM | POA: Diagnosis not present

## 2018-01-08 DIAGNOSIS — I129 Hypertensive chronic kidney disease with stage 1 through stage 4 chronic kidney disease, or unspecified chronic kidney disease: Secondary | ICD-10-CM | POA: Diagnosis not present

## 2018-01-08 DIAGNOSIS — G894 Chronic pain syndrome: Secondary | ICD-10-CM | POA: Diagnosis not present

## 2018-01-09 DIAGNOSIS — G894 Chronic pain syndrome: Secondary | ICD-10-CM | POA: Diagnosis not present

## 2018-01-09 DIAGNOSIS — M1991 Primary osteoarthritis, unspecified site: Secondary | ICD-10-CM | POA: Diagnosis not present

## 2018-01-09 DIAGNOSIS — M419 Scoliosis, unspecified: Secondary | ICD-10-CM | POA: Diagnosis not present

## 2018-01-09 DIAGNOSIS — I129 Hypertensive chronic kidney disease with stage 1 through stage 4 chronic kidney disease, or unspecified chronic kidney disease: Secondary | ICD-10-CM | POA: Diagnosis not present

## 2018-01-09 DIAGNOSIS — M810 Age-related osteoporosis without current pathological fracture: Secondary | ICD-10-CM | POA: Diagnosis not present

## 2018-01-09 DIAGNOSIS — M5010 Cervical disc disorder with radiculopathy, unspecified cervical region: Secondary | ICD-10-CM | POA: Diagnosis not present

## 2018-01-29 NOTE — Progress Notes (Signed)
Dg  

## 2018-01-30 ENCOUNTER — Ambulatory Visit (INDEPENDENT_AMBULATORY_CARE_PROVIDER_SITE_OTHER): Payer: Medicare HMO

## 2018-01-30 ENCOUNTER — Encounter: Payer: Self-pay | Admitting: Podiatry

## 2018-01-30 ENCOUNTER — Ambulatory Visit: Payer: Medicare HMO | Admitting: Podiatry

## 2018-01-30 DIAGNOSIS — M19071 Primary osteoarthritis, right ankle and foot: Secondary | ICD-10-CM

## 2018-01-30 DIAGNOSIS — M659 Synovitis and tenosynovitis, unspecified: Secondary | ICD-10-CM | POA: Diagnosis not present

## 2018-01-30 DIAGNOSIS — M779 Enthesopathy, unspecified: Secondary | ICD-10-CM | POA: Diagnosis not present

## 2018-01-31 ENCOUNTER — Telehealth: Payer: Self-pay | Admitting: Podiatry

## 2018-01-31 NOTE — Telephone Encounter (Signed)
Patient said you were suppose to send in a medication for her to  [facility] [address] [place], [place] [address]  [phone]

## 2018-01-31 NOTE — Telephone Encounter (Signed)
Dr. Amalia Hailey, what medication was you going to call in for this patient?  She could not tell me what it was or what it was for and the office note is not in chart yet   Please advise

## 2018-01-31 NOTE — Telephone Encounter (Signed)
Patient said you were suppose to send in a medication for her to [facility].

## 2018-02-01 MED ORDER — MELOXICAM 15 MG PO TABS: 15.0000 mg | ORAL_TABLET | Freq: Every day | ORAL | 3 refills | Status: AC

## 2018-02-01 NOTE — Telephone Encounter (Signed)
Please send Meloxicam   Thanks!

## 2018-02-01 NOTE — Progress Notes (Signed)
   Subjective:  84 year old female presenting today with a chief complaint of an aching right ankle that began 1-2 years ago. She reports associated swelling. Walking and standing for long periods of time increases the symptoms. She has been wrapping it in an ace bandage and taking tramadol which helps alleviate the pain. Patient is here for further evaluation and treatment.   Past Medical History:  Diagnosis Date  . Chronic back pain   . Chronic sciatica 03/11/2014  . Gonalgia 06/10/2014  . Hypertension   . Hypothyroidism   . Low back pain with sciatica 05/14/2014  . MVP (mitral valve prolapse)   . Scoliosis      Objective / Physical Exam:  General:  The patient is alert and oriented x3 in no acute distress. Dermatology:  Skin is warm, dry and supple bilateral lower extremities. Negative for open lesions or macerations. Vascular:  Palpable pedal pulses bilaterally. No edema or erythema noted. Capillary refill within normal limits. Neurological:  Epicritic and protective threshold grossly intact bilaterally.  Musculoskeletal Exam:  Pain on palpation to the anterior lateral medial aspects of the patient's right ankle. Mild edema noted. Range of motion within normal limits to all pedal and ankle joints bilateral. Muscle strength 5/5 in all groups bilateral.   Radiographic Exam:  DJD noted to the right ankle joint. No fracture/dislocation/boney destruction.    Assessment: 1. pain in right ankle 2. synovitis of right ankle 3. capsulitis of right ankle  Plan of Care:  1. Patient was evaluated. X-Rays reviewed.  2. injection of 0.5 mL Celestone Soluspan injected in the patient's right ankle. 3. Prescription for meloxicam provided to patient.  4. ankle brace dispensed 5. patient is to return to clinic in 6 weeks  Edrick Kins, DPM [facility]  Dr. Edrick Kins, [address]                                        [place], [place] [address]                 Office [phone]  Fax [phone]

## 2018-02-06 ENCOUNTER — Telehealth: Payer: Self-pay | Admitting: Podiatry

## 2018-02-06 NOTE — Telephone Encounter (Signed)
Pt stated that she received a brace yesterday wanted to come in to [facility] one day to get another brace that is not so hard on her back when she has to put it on. Please give patient a call this week .

## 2018-02-08 DIAGNOSIS — I10 Essential (primary) hypertension: Secondary | ICD-10-CM | POA: Diagnosis not present

## 2018-02-08 NOTE — Telephone Encounter (Signed)
I called patient back, she reported that she can't bend over to put the brace on and its too hard.  I informed her that she could come by the office and I would be happy to instruct her on putting brace on.  She stated that she would think about it and get back to me.

## 2018-02-13 ENCOUNTER — Ambulatory Visit: Payer: Self-pay | Admitting: Podiatry

## 2018-02-15 DIAGNOSIS — F325 Major depressive disorder, single episode, in full remission: Secondary | ICD-10-CM | POA: Diagnosis not present

## 2018-02-15 DIAGNOSIS — M79671 Pain in right foot: Secondary | ICD-10-CM | POA: Diagnosis not present

## 2018-02-15 DIAGNOSIS — I10 Essential (primary) hypertension: Secondary | ICD-10-CM | POA: Diagnosis not present

## 2018-03-13 ENCOUNTER — Encounter: Payer: Self-pay | Admitting: Podiatry

## 2018-03-25 NOTE — Progress Notes (Signed)
This encounter was created in error - please disregard.

## 2018-05-15 DIAGNOSIS — N183 Chronic kidney disease, stage 3 (moderate): Secondary | ICD-10-CM | POA: Diagnosis not present

## 2018-05-22 DIAGNOSIS — Z0000 Encounter for general adult medical examination without abnormal findings: Secondary | ICD-10-CM | POA: Diagnosis not present

## 2018-05-22 DIAGNOSIS — I10 Essential (primary) hypertension: Secondary | ICD-10-CM | POA: Diagnosis not present

## 2018-05-22 DIAGNOSIS — Z23 Encounter for immunization: Secondary | ICD-10-CM | POA: Diagnosis not present

## 2018-09-12 DIAGNOSIS — M2011 Hallux valgus (acquired), right foot: Secondary | ICD-10-CM | POA: Diagnosis not present

## 2018-09-12 DIAGNOSIS — M2041 Other hammer toe(s) (acquired), right foot: Secondary | ICD-10-CM | POA: Diagnosis not present

## 2018-09-12 DIAGNOSIS — G8929 Other chronic pain: Secondary | ICD-10-CM | POA: Diagnosis not present

## 2018-09-12 DIAGNOSIS — I872 Venous insufficiency (chronic) (peripheral): Secondary | ICD-10-CM | POA: Diagnosis not present

## 2018-09-12 DIAGNOSIS — M25571 Pain in right ankle and joints of right foot: Secondary | ICD-10-CM | POA: Diagnosis not present

## 2018-10-02 DIAGNOSIS — M5441 Lumbago with sciatica, right side: Secondary | ICD-10-CM | POA: Diagnosis not present

## 2018-10-02 DIAGNOSIS — G8929 Other chronic pain: Secondary | ICD-10-CM | POA: Diagnosis not present

## 2018-10-16 DIAGNOSIS — G8929 Other chronic pain: Secondary | ICD-10-CM | POA: Diagnosis not present

## 2018-10-16 DIAGNOSIS — E079 Disorder of thyroid, unspecified: Secondary | ICD-10-CM | POA: Diagnosis not present

## 2018-10-16 DIAGNOSIS — M419 Scoliosis, unspecified: Secondary | ICD-10-CM | POA: Diagnosis not present

## 2018-10-16 DIAGNOSIS — I10 Essential (primary) hypertension: Secondary | ICD-10-CM | POA: Diagnosis not present

## 2018-10-16 DIAGNOSIS — M5441 Lumbago with sciatica, right side: Secondary | ICD-10-CM | POA: Diagnosis not present

## 2018-10-23 DIAGNOSIS — G8929 Other chronic pain: Secondary | ICD-10-CM | POA: Diagnosis not present

## 2018-10-23 DIAGNOSIS — M5441 Lumbago with sciatica, right side: Secondary | ICD-10-CM | POA: Diagnosis not present

## 2018-10-23 DIAGNOSIS — E079 Disorder of thyroid, unspecified: Secondary | ICD-10-CM | POA: Diagnosis not present

## 2018-10-23 DIAGNOSIS — I10 Essential (primary) hypertension: Secondary | ICD-10-CM | POA: Diagnosis not present

## 2018-10-23 DIAGNOSIS — M419 Scoliosis, unspecified: Secondary | ICD-10-CM | POA: Diagnosis not present

## 2018-10-25 DIAGNOSIS — M419 Scoliosis, unspecified: Secondary | ICD-10-CM | POA: Diagnosis not present

## 2018-10-25 DIAGNOSIS — I10 Essential (primary) hypertension: Secondary | ICD-10-CM | POA: Diagnosis not present

## 2018-10-25 DIAGNOSIS — E079 Disorder of thyroid, unspecified: Secondary | ICD-10-CM | POA: Diagnosis not present

## 2018-10-25 DIAGNOSIS — G8929 Other chronic pain: Secondary | ICD-10-CM | POA: Diagnosis not present

## 2018-10-25 DIAGNOSIS — M5441 Lumbago with sciatica, right side: Secondary | ICD-10-CM | POA: Diagnosis not present

## 2018-10-29 DIAGNOSIS — I10 Essential (primary) hypertension: Secondary | ICD-10-CM | POA: Diagnosis not present

## 2018-10-29 DIAGNOSIS — E079 Disorder of thyroid, unspecified: Secondary | ICD-10-CM | POA: Diagnosis not present

## 2018-10-29 DIAGNOSIS — M5441 Lumbago with sciatica, right side: Secondary | ICD-10-CM | POA: Diagnosis not present

## 2018-10-29 DIAGNOSIS — M419 Scoliosis, unspecified: Secondary | ICD-10-CM | POA: Diagnosis not present

## 2018-10-29 DIAGNOSIS — G8929 Other chronic pain: Secondary | ICD-10-CM | POA: Diagnosis not present

## 2018-11-02 DIAGNOSIS — M5441 Lumbago with sciatica, right side: Secondary | ICD-10-CM | POA: Diagnosis not present

## 2018-11-07 DIAGNOSIS — M419 Scoliosis, unspecified: Secondary | ICD-10-CM | POA: Diagnosis not present

## 2018-11-07 DIAGNOSIS — I10 Essential (primary) hypertension: Secondary | ICD-10-CM | POA: Diagnosis not present

## 2018-11-07 DIAGNOSIS — M5441 Lumbago with sciatica, right side: Secondary | ICD-10-CM | POA: Diagnosis not present

## 2018-11-07 DIAGNOSIS — E079 Disorder of thyroid, unspecified: Secondary | ICD-10-CM | POA: Diagnosis not present

## 2018-11-07 DIAGNOSIS — G8929 Other chronic pain: Secondary | ICD-10-CM | POA: Diagnosis not present

## 2018-11-09 DIAGNOSIS — E079 Disorder of thyroid, unspecified: Secondary | ICD-10-CM | POA: Diagnosis not present

## 2018-11-09 DIAGNOSIS — I10 Essential (primary) hypertension: Secondary | ICD-10-CM | POA: Diagnosis not present

## 2018-11-09 DIAGNOSIS — M5441 Lumbago with sciatica, right side: Secondary | ICD-10-CM | POA: Diagnosis not present

## 2018-11-09 DIAGNOSIS — M419 Scoliosis, unspecified: Secondary | ICD-10-CM | POA: Diagnosis not present

## 2018-11-09 DIAGNOSIS — G8929 Other chronic pain: Secondary | ICD-10-CM | POA: Diagnosis not present

## 2018-11-14 DIAGNOSIS — I10 Essential (primary) hypertension: Secondary | ICD-10-CM | POA: Diagnosis not present

## 2018-11-14 DIAGNOSIS — M419 Scoliosis, unspecified: Secondary | ICD-10-CM | POA: Diagnosis not present

## 2018-11-14 DIAGNOSIS — G8929 Other chronic pain: Secondary | ICD-10-CM | POA: Diagnosis not present

## 2018-11-14 DIAGNOSIS — M5441 Lumbago with sciatica, right side: Secondary | ICD-10-CM | POA: Diagnosis not present

## 2018-11-14 DIAGNOSIS — E079 Disorder of thyroid, unspecified: Secondary | ICD-10-CM | POA: Diagnosis not present

## 2018-11-19 DIAGNOSIS — J0100 Acute maxillary sinusitis, unspecified: Secondary | ICD-10-CM | POA: Diagnosis not present

## 2018-11-22 DIAGNOSIS — M419 Scoliosis, unspecified: Secondary | ICD-10-CM | POA: Diagnosis not present

## 2018-11-22 DIAGNOSIS — E079 Disorder of thyroid, unspecified: Secondary | ICD-10-CM | POA: Diagnosis not present

## 2018-11-22 DIAGNOSIS — G8929 Other chronic pain: Secondary | ICD-10-CM | POA: Diagnosis not present

## 2018-11-22 DIAGNOSIS — M5441 Lumbago with sciatica, right side: Secondary | ICD-10-CM | POA: Diagnosis not present

## 2018-11-22 DIAGNOSIS — I10 Essential (primary) hypertension: Secondary | ICD-10-CM | POA: Diagnosis not present

## 2018-11-26 DIAGNOSIS — R197 Diarrhea, unspecified: Secondary | ICD-10-CM | POA: Diagnosis not present

## 2018-11-27 DIAGNOSIS — R197 Diarrhea, unspecified: Secondary | ICD-10-CM | POA: Diagnosis not present

## 2019-01-14 DIAGNOSIS — F419 Anxiety disorder, unspecified: Secondary | ICD-10-CM | POA: Diagnosis not present

## 2019-01-14 DIAGNOSIS — I10 Essential (primary) hypertension: Secondary | ICD-10-CM | POA: Diagnosis not present

## 2019-01-14 DIAGNOSIS — M48061 Spinal stenosis, lumbar region without neurogenic claudication: Secondary | ICD-10-CM | POA: Diagnosis not present

## 2019-01-14 DIAGNOSIS — Z96653 Presence of artificial knee joint, bilateral: Secondary | ICD-10-CM | POA: Diagnosis not present

## 2019-01-14 DIAGNOSIS — E039 Hypothyroidism, unspecified: Secondary | ICD-10-CM | POA: Diagnosis not present

## 2019-01-14 DIAGNOSIS — M810 Age-related osteoporosis without current pathological fracture: Secondary | ICD-10-CM | POA: Diagnosis not present

## 2019-01-14 DIAGNOSIS — G894 Chronic pain syndrome: Secondary | ICD-10-CM | POA: Diagnosis not present

## 2019-01-14 DIAGNOSIS — N183 Chronic kidney disease, stage 3 (moderate): Secondary | ICD-10-CM | POA: Diagnosis not present

## 2019-01-14 DIAGNOSIS — F325 Major depressive disorder, single episode, in full remission: Secondary | ICD-10-CM | POA: Diagnosis not present

## 2019-01-28 ENCOUNTER — Ambulatory Visit (INDEPENDENT_AMBULATORY_CARE_PROVIDER_SITE_OTHER): Payer: Medicare HMO | Admitting: Licensed Clinical Social Worker

## 2019-01-28 DIAGNOSIS — F4322 Adjustment disorder with anxiety: Secondary | ICD-10-CM | POA: Diagnosis not present

## 2019-01-28 NOTE — Progress Notes (Signed)
Comprehensive Clinical Assessment (CCA) Note  01/28/2019 April Oliver [id number]   Virtual Visit via Telephone Note  I connected with April Oliver on 01/28/19 at 10:00 AM EDT by telephone and verified that I am speaking with the correct person using two identifiers.  I discussed the limitations, risks, security and privacy concerns of performing an evaluation and management service by telephone and the availability of in person appointments. I also discussed with the patient that there may be a patient responsible charge related to this service. The patient expressed understanding and agreed to proceed.  I discussed the assessment and treatment plan with the patient. The patient was provided an opportunity to ask questions and all were answered. The patient agreed with the plan and demonstrated an understanding of the instructions.   The patient was advised to call back or seek an in-person evaluation if the symptoms worsen or if the condition fails to improve as anticipated.  I provided 56 minutes of non-face-to-face time during this encounter.   [name], LCSW    Visit Diagnosis:      ICD-10-CM   1. Adjustment disorder with anxious mood F43.22       CCA Part One  Part One has been completed on paper by the patient.  (See scanned document in Chart Review)  CCA Part Two A  Intake/Chief Complaint:  CCA Intake With Chief Complaint CCA Part Two Date: 01/28/19 CCA Part Two Time: 85 Chief Complaint/Presenting Problem: nervousness, trouble sleeping and pain since going off Clonazepam Patients Currently Reported Symptoms/Problems: nervousness, trouble sleeping, uncomfortable and anxious, increased pain Collateral Involvement: sister, adult daughters Individual's Strengths: resilience, good support system Individual's Preferences: medication management for pain and sleep Initial Clinical Notes/Concerns: reports not anxious until stopped take Clonazepam, but reports it was  never for anxiety, only to sleep; reports has never had depression "but they've tried to treat me for it"  Patient Report: Patient lives alone but has the support of her 4 adult children, 2 of whom live locally. She is very close to her younger sister, who is moving to [place] from [place] in a couple of weeks. Patient was married young, and her first husband left before their fourth child was born. She reports that she remarried a doctor, and he would give her strong pain medication for small injuries, which is part of the reason she left him. At age 85, while married to him, she was thrown by a horse nad sustained back injuries that have resulted in several surgeries and chronic pain that she still experiences. Patient was married to her third husband for over 32 years, until his death. She reports that they lived in [place] when she had her first knee surgery, and following that she was prescribed Clonazepam. She reports that her bottle was lost at the last refill, so she had to go off it cold [place] and when she saw her new primary physician he would not re-prescribe due to concerns about her falling. She was prescribed  Buspar but after taking it once reports it did not help and actually made her feel worse. Patient is now taking Seroquel to help her sleep but is still experiencing the increase in pain and nervousness that has been there since discontinuing Clonazepam. She denies any anxiety before stopping the medication and does not believe she has ever been depressed, just in pain after surgeries. Patient states she is not interested in behavioral health counseling, as she sees no need for it. She indicates that she took  Clonazepam for many years and never knew it was for anxiety - she took it for sleep.    Mental Health Symptoms Depression:  Depression: Sleep (too much or little), Fatigue  Mania:  Mania: N/A  Anxiety:   Anxiety: Restlessness, Sleep, Worrying  Psychosis:  Psychosis: N/A   Trauma:  Trauma: N/A  Obsessions:  Obsessions: N/A  Compulsions:  Compulsions: N/A  Inattention:  Inattention: N/A  Hyperactivity/Impulsivity:  Hyperactivity/Impulsivity: N/A  Oppositional/Defiant Behaviors:  Oppositional/Defiant Behaviors: N/A  Borderline Personality:  Emotional Irregularity: N/A  Other Mood/Personality Symptoms:      Mental Status Exam Appearance and self-care  Stature:   unk - telephone session  Weight:    unk - telephone session  Clothing:    unk - telephone session  Grooming:    unk - telephone session  Cosmetic use:    unk - telephone session  Posture/gait:    unk - telephone session  Motor activity:    unk - telephone session  Sensorium  Attention:  Attention: Normal  Concentration:  Concentration: Normal  Orientation:  Orientation: X5  Recall/memory:  Recall/Memory: Normal  Affect and Mood  Affect:    unk - telephone session  Mood:    unk - telephone session  Relating  Eye contact:    unk - telephone session  Facial expression:    unk - telephone session  Attitude toward examiner:    unk - telephone session  Thought and Language  Speech flow: Speech Flow: Normal  Thought content:  Thought Content: Appropriate to mood and circumstances  Preoccupation:  Preoccupations: Ruminations  Hallucinations:    unk - telephone session  Organization:    unk - telephone session  Transport planner of Knowledge:  Fund of Knowledge: Average  Intelligence:  Intelligence: Average  Abstraction:  Abstraction: Normal  Judgement:  Judgement: Fair  Art therapist:  Reality Testing: Adequate  Insight:  Insight: Unaware  Decision Making:  Decision Making: Normal  Social Functioning  Social Maturity:  Social Maturity: Responsible  Social Judgement:  Social Judgement: Normal  Stress  Stressors:  Stressors: Illness  Coping Ability:  Coping Ability: Normal  Skill Deficits:   physcal capabilities due to pain  Supports:   sister, daughters   Family and  Psychosocial History: Family history Marital status: Widowed Widowed, when?: 2016 after 37 years of marriage Are you sexually active?: No Does patient have children?: Yes How many children?: 4 How is patient's relationship with their children?: son - [place], daughter in [place], daughter in [place], daughter in [place]; great relationship with all of them  Childhood History:  Childhood History By whom was/is the patient raised?: Both parents Additional childhood history information: great childhood Description of patient's relationship with caregiver when they were a child: wonderful; perfect dad and mother, mom is very strong Patient's description of current relationship with people who raised him/her: both deceased How were you disciplined when you got in trouble as a child/adolescent?: strict but kind Does patient have siblings?: Yes Number of Siblings: 1 Description of patient's current relationship with siblings: close, 16 years younger, moving from [place] this month Did patient suffer any verbal/emotional/physical/sexual abuse as a child?: No Did patient suffer from severe childhood neglect?: No Has patient ever been sexually abused/assaulted/raped as an adolescent or adult?: No Was the patient ever a victim of a crime or a disaster?: No Witnessed domestic violence?: No Has patient been effected by domestic violence as an adult?: No  CCA Part Two B  Employment/Work Situation:  Employment / Work Copywriter, 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 been impacted by current illness: No Did You Receive Any Psychiatric Treatment/Services While in Passenger transport manager?: No Are There Guns or Other Weapons in [place]?: No Are These Psychologist, educational?: Yes  Education: Education Last Grade Completed: 12 Did Teacher, adult education From [employer]?: Yes Did Physicist, medical?: Yes What Type of College Degree Do you Have?: some nursing school Did [employer] Attend Graduate School?: No Did You  Have An Individualized Education Program (IIEP): No Did You Have Any Difficulty At School?: No  Religion: Religion/Spirituality Are You A Religious Person?: Yes What is Your Religious Affiliation?: Presbyterian How Might This Affect Treatment?: would not affect treatment  Leisure/Recreation: Leisure / Recreation Leisure and Hobbies: used to play golf, liked to be active but pain prohibts that, likes to paint  Exercise/Diet: Exercise/Diet Do You Exercise?: No Have You Gained or Lost A Significant Amount of Weight in the Past Six Months?: No Do You Follow a Special Diet?: No Do You Have Any Trouble Sleeping?: Yes Explanation of Sleeping Difficulties: takes a long time to get to sleep  CCA Part Two C  Alcohol/Drug Use: Alcohol / Drug Use History of alcohol / drug use?: No history of alcohol / drug abuse Drinks 1 glass of wine with dinner most nights, no other alcohol or substance use   CCA Part Three  ASAM's:  Six Dimensions of Multidimensional Assessment  Dimension 1:  Acute Intoxication and/or Withdrawal Potential:  Dimension 1:  Comments: possible withdrawal from Clonazepam  Dimension 2:  Biomedical Conditions and Complications:  Dimension 2:  Comments: chronic pain  Dimension 3:  Emotional, Behavioral, or Cognitive Conditions and Complications:  Dimension 3:  Comments: denies anything outside of nervousness since ending medication  Dimension 4:  Readiness to Change:     Dimension 5:  Relapse, Continued use, or Continued Problem Potential:     Dimension 6:  Recovery/Living Environment:      Substance use Disorder (SUD)    Social Function:  Social Functioning Social Maturity: Responsible Social Judgement: Normal  Stress:  Stress Stressors: Illness Coping Ability: Normal Patient Takes Medications The Way The Doctor Instructed?: Yes Priority Risk: Low Acuity  Risk Assessment- Self-Harm Potential: Risk Assessment For Self-Harm Potential Thoughts of Self-Harm: No  current thoughts Method: No plan Availability of Means: No access/NA  Risk Assessment -Dangerous to Others Potential: Risk Assessment For Dangerous to Others Potential Method: No Plan Availability of Means: No access or NA Intent: Vague intent or NA Notification Required: No need or identified person  DSM5 Diagnoses: Patient Active Problem List   Diagnosis Date Noted  . Chronic bilateral knee pain 07/20/2015  . Chronic low back pain 07/20/2015  . Lumbar spondylosis 07/20/2015  . Long term current use of opiate analgesic 07/20/2015  . Long term prescription opiate use 07/20/2015  . Opiate use 07/20/2015  . Opiate dependence ([place]) 07/20/2015  . Encounter for therapeutic drug level monitoring 07/20/2015  . Encounter for chronic pain management 07/20/2015  . Lumbar facet syndrome 07/20/2015  . Chronic lumbar radicular pain 07/20/2015  . Plantar fasciitis 04/16/2015  . Tendonitis 04/16/2015  . Chronic pain associated with significant psychosocial dysfunction 03/30/2015  . Clinical depression 03/21/2015  . OP (osteoporosis) 03/21/2015  . RAD (reactive airway disease) 10/27/2014  . Osteopenia 09/22/2014  . History of artificial joint 08/05/2014  . False positive serological test for syphilis 07/31/2014  . Chronic back pain 06/23/2014  . Instability of knee joint 06/10/2014  .  History of bilateral knee replacements 06/10/2014  . Complication of joint prosthesis ([place]) 03/17/2014  . Anxiety 03/11/2014  . BP (high blood pressure) 03/11/2014  . Adult hypothyroidism 03/11/2014    Patient Centered Plan: Patient is on the following Treatment Plan(s):  General Treatment Plan (Adjustment Disorder)  Recommendations for Services/Supports/Treatments: Recommendations for Services/Supports/Treatments Recommendations For Services/Supports/Treatments: Medication Management  Treatment Plan Summary:  Increase ability to adjust to current life circumstances  [name]

## 2019-02-14 ENCOUNTER — Encounter: Payer: Self-pay | Admitting: Psychiatry

## 2019-02-14 ENCOUNTER — Ambulatory Visit (INDEPENDENT_AMBULATORY_CARE_PROVIDER_SITE_OTHER): Payer: Medicare HMO | Admitting: Psychiatry

## 2019-02-14 ENCOUNTER — Other Ambulatory Visit: Payer: Self-pay

## 2019-02-14 DIAGNOSIS — G4701 Insomnia due to medical condition: Secondary | ICD-10-CM

## 2019-02-14 DIAGNOSIS — F1320 Sedative, hypnotic or anxiolytic dependence, uncomplicated: Secondary | ICD-10-CM | POA: Diagnosis not present

## 2019-02-14 DIAGNOSIS — F419 Anxiety disorder, unspecified: Secondary | ICD-10-CM

## 2019-02-14 MED ORDER — BUSPIRONE HCL 5 MG PO TABS: 5.0000 mg | ORAL_TABLET | Freq: Two times a day (BID) | ORAL | 1 refills | Status: DC

## 2019-02-14 MED ORDER — QUETIAPINE FUMARATE 25 MG PO TABS: 37.5000 mg | ORAL_TABLET | Freq: Every day | ORAL | 1 refills | Status: DC

## 2019-02-14 NOTE — Progress Notes (Signed)
Virtual Visit via Video Note  I connected with April Oliver on 02/14/19 at  1:00 PM EDT by a video enabled telemedicine application and verified that I am speaking with the correct person using two identifiers.   I discussed the limitations of evaluation and management by telemedicine and the availability of in person appointments. The patient expressed understanding and agreed to proceed.   I discussed the assessment and treatment plan with the patient. The patient was provided an opportunity to ask questions and all were answered. The patient agreed with the plan and demonstrated an understanding of the instructions.   The patient was advised to call back or seek an in-person evaluation if the symptoms worsen or if the condition fails to improve as anticipated.    Psychiatric Initial Adult Assessment   Patient Identification: April Oliver MRN:  [id number] Date of Evaluation:  02/14/2019 Referral Source: Dr.Bert Caryl Oliver Chief Complaint:   Chief Complaint    Establish Care     Visit Diagnosis:    ICD-10-CM   1. Anxiety disorder, unspecified type  F41.9 busPIRone (BUSPAR) 5 MG tablet    QUEtiapine (SEROQUEL) 25 MG tablet  2. Insomnia due to medical condition  G47.01 QUEtiapine (SEROQUEL) 25 MG tablet  3. Benzodiazepine dependence (HCC)  F13.20     History of Present Illness:  April Oliver is an 85 year old patient female, retired, lives in [place], has a history of hypertension, chronic pain, hypothyroidism, benzodiazepine dependence, was evaluated by telemedicine today.  Patient's daughter April Oliver provided collateral information.  Patient reports she was prescribed clonazepam more than 10 years ago.  Patient does not really know why she was given the clonazepam.  She denies any significant anxiety symptoms previously.  She denies a diagnosis of anxiety disorder or panic attacks.  She denies any other mental health problems in the past.  She believes it may have been for pain however is not  sure.  She also wonders whether it was given to her for sleep at some point.  Patient reports that she recently got established with her new primary care provider who discussed with her that she should not be on long-term benzodiazepine therapy and needed to be tapered off.  Her primary care provider is currently tapering her off of the clonazepam.  Patient however reports ever since the clonazepam is being tapered down she has been struggling with sleep problems.  However she is taking the clonazepam half tablet every day now which seems to help her with her anxiety and sleep  better.  She denies any other withdrawal symptoms.  Reports sleep is restless.  She reports she goes to bed at around 9 PM however wakes up at around 3 AM.  She reports she is awake for an hour and then is able to go back to bed and sleep till 6 AM.  She believes coming off of the clonazepam has something to do with her sleep problems.  She currently takes Seroquel as well as trazodone.  She has been on trazodone since the past several years.  She was recently started on Seroquel.  She reports she was given BuSpar 15 mg which she took at bedtime when it was started however she stopped taking it since it affected her sleep at night.  She appeared to be alert, oriented to person place and time situation.  Her 3 word memory immediate was 3 out of 3 and after 5 minutes was 2 out of 3.  Her attention and concentration were affected and  she had difficulty with calculation.  She however was able to do a clock well with the right time.  Patient denies any suicidality, homicidality or perceptual disturbances.  Patient has good support system from an aide who Oliver in couple of times a week as well as her daughters who live close to her.  Patient denies any history of trauma.  Patient denies any history of substance abuse problems.    Associated Signs/Symptoms: Depression Symptoms:  insomnia, (Hypo) Manic Symptoms:  Denies Anxiety  Symptoms:  Anxiety unspecified Psychotic Symptoms:  Denies PTSD Symptoms: Negative  Past Psychiatric History: Patient with past history of sleep problems.  She denies any other history of mental health problems however was prescribed clonazepam several years ago.  She reports she was seen by her primary physician who had started her on clonazepam more than 10 years ago.  She denies suicide attempts.  She denies inpatient mental health admissions.  Past trials of medications like Ambien, trazodone.  Previous Psychotropic Medications: Yes Ambien, trazodone, Klonopin  Substance Abuse History in the last 12 months:  No.  Consequences of Substance Abuse: Negative  Past Medical History:  Past Medical History:  Diagnosis Date  . Chronic back pain   . Chronic sciatica 03/11/2014  . Gonalgia 06/10/2014  . Hypertension   . Hypothyroidism   . Low back pain with sciatica 05/14/2014  . MVP (mitral valve prolapse)   . Scoliosis     Past Surgical History:  Procedure Laterality Date  . APPENDECTOMY    . BACK SURGERY     x 5  . CATARACT EXTRACTION Bilateral   . JOINT REPLACEMENT Bilateral    knee  . NECK SURGERY     x 2  . ROTATOR CUFF REPAIR Left     Family Psychiatric History: Denies Family History:  Family History  Problem Relation Age of Onset  . Mental illness Neg Hx     Social History:   Social History   Socioeconomic History  . Marital status: Widowed    Spouse name: Not on file  . Number of children: 4  . Years of education: Not on file  . Highest education level: Not on file  Occupational History  . Not on file  Social Needs  . Financial resource strain: Not on file  . Food insecurity    Worry: Not on file    Inability: Not on file  . Transportation needs    Medical: Not on file    Non-medical: Not on file  Tobacco Use  . Smoking status: Current Every Day Smoker    Packs/day: 0.25    Types: Cigarettes  . Smokeless tobacco: Never Used  Substance and Sexual  Activity  . Alcohol use: Yes    Alcohol/week: 7.0 standard drinks    Types: 7 Glasses of wine per week    Comment: glass of wine daily  . Drug use: Not on file  . Sexual activity: Not on file  Lifestyle  . Physical activity    Days per week: Not on file    Minutes per session: Not on file  . Stress: Not on file  Relationships  . Social Herbalist on phone: Not on file    Gets together: Not on file    Attends religious service: Not on file    Active member of club or organization: Not on file    Attends meetings of clubs or organizations: Not on file    Relationship status: Not on  file  Other Topics Concern  . Not on file  Social History Narrative  . Not on file    Additional Social History: Patient is widowed.  She lives in [place].  She had a good childhood.  She graduated high school and studied [employer].  She worked in the lab for several years.  She has 4 children, 1 son and 3 daughters.  Her husband passed away in 2014-12-11.  She has a sister who recently moved close to her.  She has good support system from her children.  She also has an aide who Oliver in couple of times a week.  Allergies:   Allergies  Allergen Reactions  . Ambien [Zolpidem] Other (See Comments)    Other reaction(s): Altered mental status (finding) hallucinations  . Morphine And Related Other (See Comments)    hyper    Metabolic Disorder Labs: No results found for: HGBA1C, MPG No results found for: PROLACTIN No results found for: CHOL, TRIG, HDL, CHOLHDL, VLDL, LDLCALC No results found for: TSH  Therapeutic Level Labs: No results found for: LITHIUM No results found for: CBMZ No results found for: VALPROATE  Current Medications: Current Outpatient Medications  Medication Sig Dispense Refill  . albuterol (VENTOLIN HFA) 108 (90 Base) MCG/ACT inhaler INHALE 2 INHALATIONS BY MOUTH INTO THE LUNGS EVERY 6 HOURS AS NEEDED FOR WHEEZING    . clonazePAM (KLONOPIN) 0.5 MG tablet  Take by mouth.    . potassium chloride (K-DUR) 10 MEQ tablet TAKE 1 TABLET BY MOUTH ONCE DAILY WITH LASIX    . QUEtiapine (SEROQUEL) 25 MG tablet Take 1.5 tablets (37.5 mg total) by mouth at bedtime. For sleep and anxiety 45 tablet 1  . traZODone (DESYREL) 100 MG tablet Take 200 mg by mouth.    Marland Kitchen albuterol-ipratropium (COMBIVENT) 18-103 MCG/ACT inhaler 18-103 mcg/Actuation    . budesonide-formoterol (SYMBICORT) 160-4.5 MCG/ACT inhaler Inhale into the lungs.    . busPIRone (BUSPAR) 5 MG tablet Take 1 tablet (5 mg total) by mouth 2 (two) times daily. At 8 am and 2 pm 60 tablet 1  . Cholecalciferol (VITAMIN D3) 25 MCG (1000 UT) CAPS Take by mouth.    . clonazePAM (KLONOPIN) 1 MG tablet     . diclofenac (VOLTAREN) 75 MG EC tablet Take 1 tablet (75 mg total) by mouth 2 (two) times daily. 30 tablet 0  . furosemide (LASIX) 40 MG tablet     . gabapentin (NEURONTIN) 300 MG capsule 800 mg.    . levothyroxine (SYNTHROID, LEVOTHROID) 75 MCG tablet     . Melatonin 3 MG TABS Take by mouth.    . meloxicam (MOBIC) 15 MG tablet Take 1 tablet (15 mg total) by mouth daily. 30 tablet 3  . metoprolol tartrate (LOPRESSOR) 25 MG tablet     . Multiple Vitamins-Minerals (CENTRUM SILVER PO) Take by mouth.    Marland Kitchen tiZANidine (ZANAFLEX) 4 MG tablet     . traMADol (ULTRAM) 50 MG tablet TAKE 1 TABLET BY MOUTH EVERY 8 HOURS AS NEEDED FOR PAIN.    Marland Kitchen Zinc 50 MG TABS Take by mouth.     Current Facility-Administered Medications  Medication Dose Route Frequency Provider Last Rate Last Dose  . triamcinolone acetonide (KENALOG) 10 MG/ML injection 10 mg  10 mg Other Once Wallene Huh, DPM        Musculoskeletal: Strength & Muscle Tone: UTA Gait & Station: Reports as wnl Patient leans: N/A  Psychiatric Specialty Exam: Review of Systems  Psychiatric/Behavioral: The patient is  nervous/anxious and has insomnia.   All other systems reviewed and are negative.   There were no vitals taken for this visit.There is no height or  weight on file to calculate BMI.  General Appearance: Casual  Eye Contact:  Fair  Speech:  Clear and Coherent  Volume:  Normal  Mood:  Anxious  Affect:  Congruent  Thought Process:  Goal Directed and Descriptions of Associations: Intact  Orientation:  Full (Time, Place, and Person)  Thought Content:  Logical  Suicidal Thoughts:  No  Homicidal Thoughts:  No  Memory:  Immediate;   Fair Recent;   Fair Remote;   Fair  Judgement:  Fair  Insight:  Fair  Psychomotor Activity:  Normal  Concentration:  Concentration: Fair and Attention Span: Fair  Recall:  [employer] of Knowledge:Fair  Language: Fair  Akathisia:  No  Handed:  Right  AIMS (if indicated): Denies tremors, rigidity, stiffness  Assets:  Communication Skills Desire for Improvement Social Support  ADL's:  Intact  Cognition: WNL  Sleep:  restless   Screenings:   Assessment and Plan: Blecha is an 85 year old Caucasian female, retired, widowed, lives in [place], has a history of chronic pain, hypothyroidism, hypertension, insomnia, benzodiazepine dependence was evaluated by telemedicine today.  Patient is currently being tapered off of clonazepam by her primary care provider.  Patient hence struggles with some anxiety and sleep problems however they are gradually getting better.  Patient has good social support system and currently denies any significant mood lability, anxiety attacks or serious withdrawal symptoms.  She appeared to be alert and oriented and was able to answer questions appropriately.  Discussed plan as noted below.  Plan For anxiety disorder unspecified-likely due to benzodiazepine being tapered off-unstable Patient is currently on a tapering protocol of clonazepam. She will continue to follow instructions per Dr. Caryl Oliver. Will add BuSpar 5 mg p.o. twice daily.  Discussed with her not to take it too late since it affects her sleep at night. Increase Seroquel to 37.5 mg p.o. nightly.  However discussed with  patient to be cautious since it can also cause her falls.  For insomnia- some improvement. She currently sleeps 7 to 8 hours although it is disrupted. Will increase Seroquel to 37.5 mg p.o. nightly Continue trazodone 200 mg p.o. nightly  Benzodiazepine dependence- improving Patient is currently being tapered off of the clonazepam. Discussed with patient the risk of being on long-term benzodiazepine therapy including  interaction with her pain medications.  Collateral information was obtained from her daughter April Oliver reports patient is not overtly anxious and does not have any significant withdrawal symptoms.  She is able to answer all questions appropriately.  She remembers personal information as well as was able to remember the names of medications.  Follow-up in clinic in 3 to 4 weeks or sooner if needed.  Patient will benefit from [facility] labs if not already done.  Appointment scheduled for July 7, 9 AM.  I have spent atleast 60 minutes non face to face with patient today. More than 50 % of the time was spent for psychoeducation and supportive psychotherapy and care coordination.  This note was generated in part or whole with voice recognition software. Voice recognition is usually quite accurate but there are transcription errors that can and very often do occur. I apologize for any typographical errors that were not detected and corrected.        Ursula Alert, MD 6/18/20206:32 PM

## 2019-02-19 ENCOUNTER — Telehealth: Payer: Self-pay

## 2019-02-19 NOTE — Telephone Encounter (Signed)
dr. Caryl Comes nurse called stated that dr. Caryl Comes wanted to speak with you in regards to patient.  she was told that dr. Shea Evans was not in the office until thursday.

## 2019-02-21 ENCOUNTER — Telehealth: Payer: Self-pay | Admitting: Psychiatry

## 2019-02-21 NOTE — Telephone Encounter (Signed)
Returned call to Dr.Klein's office. Spoke to [place] - she will pass the message to Dr.Klein , he is with patient now.

## 2019-03-07 ENCOUNTER — Other Ambulatory Visit: Payer: Self-pay

## 2019-03-07 ENCOUNTER — Ambulatory Visit (INDEPENDENT_AMBULATORY_CARE_PROVIDER_SITE_OTHER): Payer: Medicare HMO | Admitting: Psychiatry

## 2019-03-07 ENCOUNTER — Encounter: Payer: Self-pay | Admitting: Psychiatry

## 2019-03-07 DIAGNOSIS — G4701 Insomnia due to medical condition: Secondary | ICD-10-CM | POA: Diagnosis not present

## 2019-03-07 DIAGNOSIS — F411 Generalized anxiety disorder: Secondary | ICD-10-CM | POA: Insufficient documentation

## 2019-03-07 DIAGNOSIS — F419 Anxiety disorder, unspecified: Secondary | ICD-10-CM

## 2019-03-07 DIAGNOSIS — F1320 Sedative, hypnotic or anxiolytic dependence, uncomplicated: Secondary | ICD-10-CM | POA: Diagnosis not present

## 2019-03-07 MED ORDER — TRAZODONE HCL 100 MG PO TABS: 200.0000 mg | ORAL_TABLET | Freq: Every evening | ORAL | 1 refills | Status: DC | PRN

## 2019-03-07 NOTE — Progress Notes (Signed)
Virtual Visit via Video Note  I connected with April Oliver on 03/07/19 at  9:00 AM EDT by a video enabled telemedicine application and verified that I am speaking with the correct person using two identifiers.   I discussed the limitations of evaluation and management by telemedicine and the availability of in person appointments. The patient expressed understanding and agreed to proceed.   I discussed the assessment and treatment plan with the patient. The patient was provided an opportunity to ask questions and all were answered. The patient agreed with the plan and demonstrated an understanding of the instructions.   The patient was advised to call back or seek an in-person evaluation if the symptoms worsen or if the condition fails to improve as anticipated.   Egan MD OP Progress Note  03/07/2019 7:22 PM April Oliver  MRN:  [id number]  Chief Complaint:  Chief Complaint    Follow-up     HPI: [name] is an 85 year old Caucasian female, retired, lives in [place], has a history of hypertension, chronic pain, hypothyroidism, anxiety disorder unspecified, insomnia likely due to medical problems, benzodiazepine dependence was evaluated by telemedicine today.  Patient's daughter Jackelyn Poling provided collateral information.  Patient as well as daughter reports that she is currently doing well on the current medication regimen.  Her anxiety symptoms have improved on the BuSpar.  She reports sleep as better on the Seroquel.  Patient denies any side effects to the current medications.  She continues to wean off the clonazepam reports she currently takes it every other day.  She denies any significant withdrawal symptoms.  She continues to have good social support system from her family.  She is also happy that her sister currently close to her and that is a good support system for her as well.  Visit Diagnosis:    ICD-10-CM   1. Anxiety disorder, unspecified type  F41.9   2. Insomnia due  to medical condition  G47.01   3. Benzodiazepine dependence ([place])  F13.20     Past Psychiatric History: I have reviewed past psychiatric history from my progress note on 02/14/2019.  Past trials of Ambien, trazodone, Klonopin.  Past Medical History:  Past Medical History:  Diagnosis Date  . Chronic back pain   . Chronic sciatica 03/11/2014  . Gonalgia 06/10/2014  . Hypertension   . Hypothyroidism   . Low back pain with sciatica 05/14/2014  . MVP (mitral valve prolapse)   . Scoliosis     Past Surgical History:  Procedure Laterality Date  . APPENDECTOMY    . BACK SURGERY     x 5  . CATARACT EXTRACTION Bilateral   . JOINT REPLACEMENT Bilateral    knee  . NECK SURGERY     x 2  . ROTATOR CUFF REPAIR Left     Family Psychiatric History: I have reviewed family psychiatric history from my progress note on 02/14/2019.  Family History:  Family History  Problem Relation Age of Onset  . Mental illness Neg Hx     Social History: I have reviewed social history from my progress note on 02/14/2019. Social History   Socioeconomic History  . Marital status: Widowed    Spouse name: Not on file  . Number of children: 4  . Years of education: Not on file  . Highest education level: Not on file  Occupational History  . Not on file  Social Needs  . Financial resource strain: Not on file  . Food insecurity    Worry: Not on  file    Inability: Not on file  . Transportation needs    Medical: Not on file    Non-medical: Not on file  Tobacco Use  . Smoking status: Current Every Day Smoker    Packs/day: 0.25    Types: Cigarettes  . Smokeless tobacco: Never Used  Substance and Sexual Activity  . Alcohol use: Yes    Alcohol/week: 7.0 standard drinks    Types: 7 Glasses of wine per week    Comment: glass of wine daily  . Drug use: Not on file  . Sexual activity: Not on file  Lifestyle  . Physical activity    Days per week: Not on file    Minutes per session: Not on file  . Stress:  Not on file  Relationships  . Social Herbalist on phone: Not on file    Gets together: Not on file    Attends religious service: Not on file    Active member of club or organization: Not on file    Attends meetings of clubs or organizations: Not on file    Relationship status: Not on file  Other Topics Concern  . Not on file  Social History Narrative  . Not on file    Allergies:  Allergies  Allergen Reactions  . Ambien [Zolpidem] Other (See Comments)    Other reaction(s): Altered mental status (finding) hallucinations  . Morphine And Related Other (See Comments)    hyper    Metabolic Disorder Labs: No results found for: HGBA1C, MPG No results found for: PROLACTIN No results found for: CHOL, TRIG, HDL, CHOLHDL, VLDL, LDLCALC No results found for: TSH  Therapeutic Level Labs: No results found for: LITHIUM No results found for: VALPROATE No components found for:  CBMZ  Current Medications: Current Outpatient Medications  Medication Sig Dispense Refill  . albuterol (VENTOLIN HFA) 108 (90 Base) MCG/ACT inhaler INHALE 2 INHALATIONS BY MOUTH INTO THE LUNGS EVERY 6 HOURS AS NEEDED FOR WHEEZING    . albuterol-ipratropium (COMBIVENT) 18-103 MCG/ACT inhaler 18-103 mcg/Actuation    . budesonide-formoterol (SYMBICORT) 160-4.5 MCG/ACT inhaler Inhale into the lungs.    . busPIRone (BUSPAR) 5 MG tablet Take 1 tablet (5 mg total) by mouth 2 (two) times daily. At 8 am and 2 pm 60 tablet 1  . Cholecalciferol (VITAMIN D3) 25 MCG (1000 UT) CAPS Take by mouth.    . clonazePAM (KLONOPIN) 0.5 MG tablet Take by mouth.    . diclofenac (VOLTAREN) 75 MG EC tablet Take 1 tablet (75 mg total) by mouth 2 (two) times daily. 30 tablet 0  . furosemide (LASIX) 40 MG tablet     . gabapentin (NEURONTIN) 300 MG capsule 800 mg.    . levothyroxine (SYNTHROID, LEVOTHROID) 75 MCG tablet     . Melatonin 3 MG TABS Take by mouth.    . meloxicam (MOBIC) 15 MG tablet Take 1 tablet (15 mg total) by  mouth daily. 30 tablet 3  . metoprolol tartrate (LOPRESSOR) 25 MG tablet     . Multiple Vitamins-Minerals (CENTRUM SILVER PO) Take by mouth.    . potassium chloride (K-DUR) 10 MEQ tablet TAKE 1 TABLET BY MOUTH ONCE DAILY WITH LASIX    . QUEtiapine (SEROQUEL) 25 MG tablet Take 1.5 tablets (37.5 mg total) by mouth at bedtime. For sleep and anxiety 45 tablet 1  . tiZANidine (ZANAFLEX) 4 MG tablet     . traZODone (DESYREL) 100 MG tablet Take 2 tablets (200 mg total) by mouth at  bedtime as needed for sleep. 180 tablet 1  . Zinc 50 MG TABS Take by mouth.     Current Facility-Administered Medications  Medication Dose Route Frequency Provider Last Rate Last Dose  . triamcinolone acetonide (KENALOG) 10 MG/ML injection 10 mg  10 mg Other Once Wallene Huh, DPM         Musculoskeletal: Strength & Muscle Tone: within normal limits Gait & Station: normal Patient leans: N/A  Psychiatric Specialty Exam: Review of Systems  Psychiatric/Behavioral: The patient is nervous/anxious.   All other systems reviewed and are negative.   There were no vitals taken for this visit.There is no height or weight on file to calculate BMI.  General Appearance: Casual  Eye Contact:  Fair  Speech:  Normal Rate  Volume:  Normal  Mood:  Anxious improving  Affect:  Appropriate  Thought Process:  Goal Directed and Descriptions of Associations: Intact  Orientation:  Full (Time, Place, and Person)  Thought Content: Logical   Suicidal Thoughts:  No  Homicidal Thoughts:  No  Memory:  Immediate;   Fair Recent;   Fair Remote;   Fair  Judgement:  Fair  Insight:  Fair  Psychomotor Activity:  Normal  Concentration:  Concentration: Fair and Attention Span: Fair  Recall:  [employer] of Knowledge: Fair  Language: Fair  Akathisia:  No  Handed:  Right  AIMS (if indicated): denies tremors, rigidity  Assets:  Communication Skills Desire for Improvement Housing Social Support  ADL's:  Intact  Cognition: WNL  Sleep:   Fair   Screenings:   Assessment and Plan: Neumeier is an 85 year old Caucasian female, retired, widowed, lives in [place], has a history of chronic pain, hypothyroidism, hypertension, insomnia, benzodiazepine dependence, anxiety disorder unspecified was evaluated by telemedicine today.  Patient currently being tapered off of the clonazepam by her primary care provider, reports her anxiety as well as sleep as  improving on the current medication regimen.  Continues to have good support system.  Plan as noted below.  Plan For anxiety disorder unspecified-likely due to benzodiazepines being tapered off-improving Patient continues to taper off clonazepam, as per recommendations per Dr. Caryl Comes. She denies any significant withdrawal symptoms Continue BuSpar 5 mg p.o. twice daily.  She is making progress on the same. Seroquel 37.5 mg p.o. nightly.  For insomnia- improving Seroquel 37.5 mg p.o. nightly.   Trazodone 200 mg p.o. nightly  Benzodiazepine dependence- improving She is currently being tapered off and is currently doing well.  Collateral information was obtained from daughter Jackelyn Poling who reports patient has making progress.  Follow-up in clinic in 1 month or sooner if needed.  August 26 at 10:15 AM  I have spent atleast  15 minutes non face to face with patient today. More than 50 % of the time was spent for psychoeducation and supportive psychotherapy and care coordination.  This note was generated in part or whole with voice recognition software. Voice recognition is usually quite accurate but there are transcription errors that can and very often do occur. I apologize for any typographical errors that were not detected and corrected.         Ursula Alert, MD 03/07/2019, 7:22 PM

## 2019-03-15 DIAGNOSIS — H6191 Disorder of right external ear, unspecified: Secondary | ICD-10-CM | POA: Diagnosis not present

## 2019-03-15 DIAGNOSIS — I10 Essential (primary) hypertension: Secondary | ICD-10-CM | POA: Diagnosis not present

## 2019-03-15 DIAGNOSIS — N183 Chronic kidney disease, stage 3 (moderate): Secondary | ICD-10-CM | POA: Diagnosis not present

## 2019-03-15 DIAGNOSIS — E039 Hypothyroidism, unspecified: Secondary | ICD-10-CM | POA: Diagnosis not present

## 2019-03-15 DIAGNOSIS — F1320 Sedative, hypnotic or anxiolytic dependence, uncomplicated: Secondary | ICD-10-CM | POA: Diagnosis not present

## 2019-03-15 DIAGNOSIS — T84028D Dislocation of other internal joint prosthesis, subsequent encounter: Secondary | ICD-10-CM | POA: Diagnosis not present

## 2019-03-15 DIAGNOSIS — M48061 Spinal stenosis, lumbar region without neurogenic claudication: Secondary | ICD-10-CM | POA: Diagnosis not present

## 2019-03-15 DIAGNOSIS — F325 Major depressive disorder, single episode, in full remission: Secondary | ICD-10-CM | POA: Diagnosis not present

## 2019-03-15 DIAGNOSIS — E785 Hyperlipidemia, unspecified: Secondary | ICD-10-CM | POA: Diagnosis not present

## 2019-04-01 DIAGNOSIS — M48061 Spinal stenosis, lumbar region without neurogenic claudication: Secondary | ICD-10-CM | POA: Diagnosis not present

## 2019-04-01 DIAGNOSIS — I129 Hypertensive chronic kidney disease with stage 1 through stage 4 chronic kidney disease, or unspecified chronic kidney disease: Secondary | ICD-10-CM | POA: Diagnosis not present

## 2019-04-01 DIAGNOSIS — E039 Hypothyroidism, unspecified: Secondary | ICD-10-CM | POA: Diagnosis not present

## 2019-04-01 DIAGNOSIS — F419 Anxiety disorder, unspecified: Secondary | ICD-10-CM | POA: Diagnosis not present

## 2019-04-01 DIAGNOSIS — M5116 Intervertebral disc disorders with radiculopathy, lumbar region: Secondary | ICD-10-CM | POA: Diagnosis not present

## 2019-04-01 DIAGNOSIS — M810 Age-related osteoporosis without current pathological fracture: Secondary | ICD-10-CM | POA: Diagnosis not present

## 2019-04-01 DIAGNOSIS — M5010 Cervical disc disorder with radiculopathy, unspecified cervical region: Secondary | ICD-10-CM | POA: Diagnosis not present

## 2019-04-01 DIAGNOSIS — F329 Major depressive disorder, single episode, unspecified: Secondary | ICD-10-CM | POA: Diagnosis not present

## 2019-04-01 DIAGNOSIS — N183 Chronic kidney disease, stage 3 (moderate): Secondary | ICD-10-CM | POA: Diagnosis not present

## 2019-04-04 DIAGNOSIS — E039 Hypothyroidism, unspecified: Secondary | ICD-10-CM | POA: Diagnosis not present

## 2019-04-04 DIAGNOSIS — F329 Major depressive disorder, single episode, unspecified: Secondary | ICD-10-CM | POA: Diagnosis not present

## 2019-04-04 DIAGNOSIS — I129 Hypertensive chronic kidney disease with stage 1 through stage 4 chronic kidney disease, or unspecified chronic kidney disease: Secondary | ICD-10-CM | POA: Diagnosis not present

## 2019-04-04 DIAGNOSIS — M5116 Intervertebral disc disorders with radiculopathy, lumbar region: Secondary | ICD-10-CM | POA: Diagnosis not present

## 2019-04-04 DIAGNOSIS — F419 Anxiety disorder, unspecified: Secondary | ICD-10-CM | POA: Diagnosis not present

## 2019-04-04 DIAGNOSIS — M5010 Cervical disc disorder with radiculopathy, unspecified cervical region: Secondary | ICD-10-CM | POA: Diagnosis not present

## 2019-04-04 DIAGNOSIS — M810 Age-related osteoporosis without current pathological fracture: Secondary | ICD-10-CM | POA: Diagnosis not present

## 2019-04-04 DIAGNOSIS — M48061 Spinal stenosis, lumbar region without neurogenic claudication: Secondary | ICD-10-CM | POA: Diagnosis not present

## 2019-04-04 DIAGNOSIS — N183 Chronic kidney disease, stage 3 (moderate): Secondary | ICD-10-CM | POA: Diagnosis not present

## 2019-04-08 DIAGNOSIS — N183 Chronic kidney disease, stage 3 (moderate): Secondary | ICD-10-CM | POA: Diagnosis not present

## 2019-04-08 DIAGNOSIS — E039 Hypothyroidism, unspecified: Secondary | ICD-10-CM | POA: Diagnosis not present

## 2019-04-08 DIAGNOSIS — M5116 Intervertebral disc disorders with radiculopathy, lumbar region: Secondary | ICD-10-CM | POA: Diagnosis not present

## 2019-04-08 DIAGNOSIS — F329 Major depressive disorder, single episode, unspecified: Secondary | ICD-10-CM | POA: Diagnosis not present

## 2019-04-08 DIAGNOSIS — M5010 Cervical disc disorder with radiculopathy, unspecified cervical region: Secondary | ICD-10-CM | POA: Diagnosis not present

## 2019-04-08 DIAGNOSIS — M48061 Spinal stenosis, lumbar region without neurogenic claudication: Secondary | ICD-10-CM | POA: Diagnosis not present

## 2019-04-08 DIAGNOSIS — I129 Hypertensive chronic kidney disease with stage 1 through stage 4 chronic kidney disease, or unspecified chronic kidney disease: Secondary | ICD-10-CM | POA: Diagnosis not present

## 2019-04-08 DIAGNOSIS — M810 Age-related osteoporosis without current pathological fracture: Secondary | ICD-10-CM | POA: Diagnosis not present

## 2019-04-08 DIAGNOSIS — F419 Anxiety disorder, unspecified: Secondary | ICD-10-CM | POA: Diagnosis not present

## 2019-04-10 DIAGNOSIS — F419 Anxiety disorder, unspecified: Secondary | ICD-10-CM | POA: Diagnosis not present

## 2019-04-10 DIAGNOSIS — M5116 Intervertebral disc disorders with radiculopathy, lumbar region: Secondary | ICD-10-CM | POA: Diagnosis not present

## 2019-04-10 DIAGNOSIS — I129 Hypertensive chronic kidney disease with stage 1 through stage 4 chronic kidney disease, or unspecified chronic kidney disease: Secondary | ICD-10-CM | POA: Diagnosis not present

## 2019-04-10 DIAGNOSIS — E039 Hypothyroidism, unspecified: Secondary | ICD-10-CM | POA: Diagnosis not present

## 2019-04-10 DIAGNOSIS — F329 Major depressive disorder, single episode, unspecified: Secondary | ICD-10-CM | POA: Diagnosis not present

## 2019-04-10 DIAGNOSIS — M48061 Spinal stenosis, lumbar region without neurogenic claudication: Secondary | ICD-10-CM | POA: Diagnosis not present

## 2019-04-10 DIAGNOSIS — M5010 Cervical disc disorder with radiculopathy, unspecified cervical region: Secondary | ICD-10-CM | POA: Diagnosis not present

## 2019-04-10 DIAGNOSIS — M810 Age-related osteoporosis without current pathological fracture: Secondary | ICD-10-CM | POA: Diagnosis not present

## 2019-04-10 DIAGNOSIS — N183 Chronic kidney disease, stage 3 (moderate): Secondary | ICD-10-CM | POA: Diagnosis not present

## 2019-04-15 ENCOUNTER — Other Ambulatory Visit: Payer: Self-pay | Admitting: Psychiatry

## 2019-04-15 DIAGNOSIS — M48061 Spinal stenosis, lumbar region without neurogenic claudication: Secondary | ICD-10-CM | POA: Diagnosis not present

## 2019-04-15 DIAGNOSIS — M5010 Cervical disc disorder with radiculopathy, unspecified cervical region: Secondary | ICD-10-CM | POA: Diagnosis not present

## 2019-04-15 DIAGNOSIS — E039 Hypothyroidism, unspecified: Secondary | ICD-10-CM | POA: Diagnosis not present

## 2019-04-15 DIAGNOSIS — F329 Major depressive disorder, single episode, unspecified: Secondary | ICD-10-CM | POA: Diagnosis not present

## 2019-04-15 DIAGNOSIS — M810 Age-related osteoporosis without current pathological fracture: Secondary | ICD-10-CM | POA: Diagnosis not present

## 2019-04-15 DIAGNOSIS — F419 Anxiety disorder, unspecified: Secondary | ICD-10-CM | POA: Diagnosis not present

## 2019-04-15 DIAGNOSIS — M5116 Intervertebral disc disorders with radiculopathy, lumbar region: Secondary | ICD-10-CM | POA: Diagnosis not present

## 2019-04-15 DIAGNOSIS — I129 Hypertensive chronic kidney disease with stage 1 through stage 4 chronic kidney disease, or unspecified chronic kidney disease: Secondary | ICD-10-CM | POA: Diagnosis not present

## 2019-04-15 DIAGNOSIS — N183 Chronic kidney disease, stage 3 (moderate): Secondary | ICD-10-CM | POA: Diagnosis not present

## 2019-04-15 DIAGNOSIS — G4701 Insomnia due to medical condition: Secondary | ICD-10-CM

## 2019-04-17 DIAGNOSIS — M810 Age-related osteoporosis without current pathological fracture: Secondary | ICD-10-CM | POA: Diagnosis not present

## 2019-04-17 DIAGNOSIS — N183 Chronic kidney disease, stage 3 (moderate): Secondary | ICD-10-CM | POA: Diagnosis not present

## 2019-04-17 DIAGNOSIS — M48061 Spinal stenosis, lumbar region without neurogenic claudication: Secondary | ICD-10-CM | POA: Diagnosis not present

## 2019-04-17 DIAGNOSIS — F419 Anxiety disorder, unspecified: Secondary | ICD-10-CM | POA: Diagnosis not present

## 2019-04-17 DIAGNOSIS — M5010 Cervical disc disorder with radiculopathy, unspecified cervical region: Secondary | ICD-10-CM | POA: Diagnosis not present

## 2019-04-17 DIAGNOSIS — F329 Major depressive disorder, single episode, unspecified: Secondary | ICD-10-CM | POA: Diagnosis not present

## 2019-04-17 DIAGNOSIS — E039 Hypothyroidism, unspecified: Secondary | ICD-10-CM | POA: Diagnosis not present

## 2019-04-17 DIAGNOSIS — I129 Hypertensive chronic kidney disease with stage 1 through stage 4 chronic kidney disease, or unspecified chronic kidney disease: Secondary | ICD-10-CM | POA: Diagnosis not present

## 2019-04-17 DIAGNOSIS — M5116 Intervertebral disc disorders with radiculopathy, lumbar region: Secondary | ICD-10-CM | POA: Diagnosis not present

## 2019-04-24 ENCOUNTER — Ambulatory Visit (INDEPENDENT_AMBULATORY_CARE_PROVIDER_SITE_OTHER): Payer: Medicare HMO | Admitting: Psychiatry

## 2019-04-24 ENCOUNTER — Encounter: Payer: Self-pay | Admitting: Psychiatry

## 2019-04-24 ENCOUNTER — Other Ambulatory Visit: Payer: Self-pay

## 2019-04-24 DIAGNOSIS — F411 Generalized anxiety disorder: Secondary | ICD-10-CM

## 2019-04-24 DIAGNOSIS — G4701 Insomnia due to medical condition: Secondary | ICD-10-CM | POA: Diagnosis not present

## 2019-04-24 DIAGNOSIS — F1320 Sedative, hypnotic or anxiolytic dependence, uncomplicated: Secondary | ICD-10-CM
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
## 2019-04-25 DIAGNOSIS — F419 Anxiety disorder, unspecified: Secondary | ICD-10-CM | POA: Diagnosis not present

## 2019-04-25 DIAGNOSIS — M5010 Cervical disc disorder with radiculopathy, unspecified cervical region: Secondary | ICD-10-CM | POA: Diagnosis not present

## 2019-04-25 DIAGNOSIS — N183 Chronic kidney disease, stage 3 (moderate): Secondary | ICD-10-CM | POA: Diagnosis not present

## 2019-04-25 DIAGNOSIS — E039 Hypothyroidism, unspecified: Secondary | ICD-10-CM | POA: Diagnosis not present

## 2019-04-25 DIAGNOSIS — M48061 Spinal stenosis, lumbar region without neurogenic claudication: Secondary | ICD-10-CM | POA: Diagnosis not present

## 2019-04-25 DIAGNOSIS — F329 Major depressive disorder, single episode, unspecified: Secondary | ICD-10-CM | POA: Diagnosis not present

## 2019-04-25 DIAGNOSIS — M5116 Intervertebral disc disorders with radiculopathy, lumbar region: Secondary | ICD-10-CM | POA: Diagnosis not present

## 2019-04-25 DIAGNOSIS — M810 Age-related osteoporosis without current pathological fracture: Secondary | ICD-10-CM | POA: Diagnosis not present

## 2019-04-25 DIAGNOSIS — I129 Hypertensive chronic kidney disease with stage 1 through stage 4 chronic kidney disease, or unspecified chronic kidney disease: Secondary | ICD-10-CM | POA: Diagnosis not present

## 2019-04-26 DIAGNOSIS — F329 Major depressive disorder, single episode, unspecified: Secondary | ICD-10-CM | POA: Diagnosis not present

## 2019-04-26 DIAGNOSIS — N183 Chronic kidney disease, stage 3 (moderate): Secondary | ICD-10-CM | POA: Diagnosis not present

## 2019-04-26 DIAGNOSIS — E039 Hypothyroidism, unspecified: Secondary | ICD-10-CM | POA: Diagnosis not present

## 2019-04-26 DIAGNOSIS — M5010 Cervical disc disorder with radiculopathy, unspecified cervical region: Secondary | ICD-10-CM | POA: Diagnosis not present

## 2019-04-26 DIAGNOSIS — M48061 Spinal stenosis, lumbar region without neurogenic claudication: Secondary | ICD-10-CM | POA: Diagnosis not present

## 2019-04-26 DIAGNOSIS — I129 Hypertensive chronic kidney disease with stage 1 through stage 4 chronic kidney disease, or unspecified chronic kidney disease: Secondary | ICD-10-CM | POA: Diagnosis not present

## 2019-04-26 DIAGNOSIS — M5116 Intervertebral disc disorders with radiculopathy, lumbar region: Secondary | ICD-10-CM | POA: Diagnosis not present

## 2019-04-26 DIAGNOSIS — F419 Anxiety disorder, unspecified: Secondary | ICD-10-CM | POA: Diagnosis not present

## 2019-05-02 DIAGNOSIS — F329 Major depressive disorder, single episode, unspecified: Secondary | ICD-10-CM | POA: Diagnosis not present

## 2019-05-02 DIAGNOSIS — M810 Age-related osteoporosis without current pathological fracture: Secondary | ICD-10-CM | POA: Diagnosis not present

## 2019-05-02 DIAGNOSIS — M48061 Spinal stenosis, lumbar region without neurogenic claudication: Secondary | ICD-10-CM | POA: Diagnosis not present

## 2019-05-02 DIAGNOSIS — I129 Hypertensive chronic kidney disease with stage 1 through stage 4 chronic kidney disease, or unspecified chronic kidney disease: Secondary | ICD-10-CM | POA: Diagnosis not present

## 2019-05-02 DIAGNOSIS — F419 Anxiety disorder, unspecified: Secondary | ICD-10-CM | POA: Diagnosis not present

## 2019-05-02 DIAGNOSIS — M5116 Intervertebral disc disorders with radiculopathy, lumbar region: Secondary | ICD-10-CM | POA: Diagnosis not present

## 2019-05-02 DIAGNOSIS — E039 Hypothyroidism, unspecified: Secondary | ICD-10-CM | POA: Diagnosis not present

## 2019-05-02 DIAGNOSIS — N183 Chronic kidney disease, stage 3 (moderate): Secondary | ICD-10-CM | POA: Diagnosis not present

## 2019-05-02 DIAGNOSIS — M5010 Cervical disc disorder with radiculopathy, unspecified cervical region: Secondary | ICD-10-CM | POA: Diagnosis not present

## 2019-05-08 DIAGNOSIS — C44212 Basal cell carcinoma of skin of right ear and external auricular canal: Secondary | ICD-10-CM | POA: Diagnosis not present

## 2019-05-08 DIAGNOSIS — R234 Changes in skin texture: Secondary | ICD-10-CM | POA: Diagnosis not present

## 2019-05-08 DIAGNOSIS — L570 Actinic keratosis: Secondary | ICD-10-CM | POA: Diagnosis not present

## 2019-05-08 DIAGNOSIS — D485 Neoplasm of uncertain behavior of skin: Secondary | ICD-10-CM | POA: Diagnosis not present

## 2019-05-08 DIAGNOSIS — I872 Venous insufficiency (chronic) (peripheral): Secondary | ICD-10-CM | POA: Diagnosis not present

## 2019-05-08 DIAGNOSIS — B353 Tinea pedis: Secondary | ICD-10-CM | POA: Diagnosis not present

## 2019-05-08 DIAGNOSIS — D225 Melanocytic nevi of trunk: Secondary | ICD-10-CM | POA: Diagnosis not present

## 2019-05-28 DIAGNOSIS — N183 Chronic kidney disease, stage 3 (moderate): Secondary | ICD-10-CM | POA: Diagnosis not present

## 2019-05-28 DIAGNOSIS — E785 Hyperlipidemia, unspecified: Secondary | ICD-10-CM | POA: Diagnosis not present

## 2019-05-28 DIAGNOSIS — E039 Hypothyroidism, unspecified: Secondary | ICD-10-CM | POA: Diagnosis not present

## 2019-05-28 DIAGNOSIS — I10 Essential (primary) hypertension: Secondary | ICD-10-CM | POA: Diagnosis not present

## 2019-06-04 DIAGNOSIS — F325 Major depressive disorder, single episode, in full remission: Secondary | ICD-10-CM | POA: Diagnosis not present

## 2019-06-04 DIAGNOSIS — E785 Hyperlipidemia, unspecified: Secondary | ICD-10-CM | POA: Diagnosis not present

## 2019-06-04 DIAGNOSIS — I129 Hypertensive chronic kidney disease with stage 1 through stage 4 chronic kidney disease, or unspecified chronic kidney disease: Secondary | ICD-10-CM | POA: Diagnosis not present

## 2019-06-04 DIAGNOSIS — E039 Hypothyroidism, unspecified: Secondary | ICD-10-CM | POA: Diagnosis not present

## 2019-06-04 DIAGNOSIS — F17210 Nicotine dependence, cigarettes, uncomplicated: Secondary | ICD-10-CM | POA: Diagnosis not present

## 2019-06-04 DIAGNOSIS — Z0000 Encounter for general adult medical examination without abnormal findings: Secondary | ICD-10-CM | POA: Diagnosis not present

## 2019-06-04 DIAGNOSIS — M48061 Spinal stenosis, lumbar region without neurogenic claudication: Secondary | ICD-10-CM | POA: Diagnosis not present

## 2019-06-04 DIAGNOSIS — Z23 Encounter for immunization: Secondary | ICD-10-CM | POA: Diagnosis not present

## 2019-06-04 DIAGNOSIS — M5416 Radiculopathy, lumbar region: Secondary | ICD-10-CM | POA: Diagnosis not present

## 2019-06-04 DIAGNOSIS — N1832 Chronic kidney disease, stage 3b: Secondary | ICD-10-CM | POA: Diagnosis not present

## 2019-06-13 ENCOUNTER — Other Ambulatory Visit: Payer: Self-pay | Admitting: Psychiatry

## 2019-06-13 DIAGNOSIS — G4701 Insomnia due to medical condition: Secondary | ICD-10-CM

## 2019-06-13 DIAGNOSIS — F419 Anxiety disorder, unspecified: Secondary | ICD-10-CM

## 2019-07-03 DIAGNOSIS — L219 Seborrheic dermatitis, unspecified: Secondary | ICD-10-CM | POA: Diagnosis not present

## 2019-07-03 DIAGNOSIS — C44212 Basal cell carcinoma of skin of right ear and external auricular canal: Secondary | ICD-10-CM | POA: Diagnosis not present

## 2019-07-09 ENCOUNTER — Ambulatory Visit (INDEPENDENT_AMBULATORY_CARE_PROVIDER_SITE_OTHER): Payer: Medicare HMO | Admitting: Psychiatry

## 2019-07-09 ENCOUNTER — Other Ambulatory Visit: Payer: Self-pay

## 2019-07-09 ENCOUNTER — Encounter: Payer: Self-pay | Admitting: Psychiatry

## 2019-07-09 DIAGNOSIS — F411 Generalized anxiety disorder: Secondary | ICD-10-CM | POA: Diagnosis not present

## 2019-07-09 DIAGNOSIS — G4701 Insomnia due to medical condition: Secondary | ICD-10-CM

## 2019-07-09 DIAGNOSIS — F1321 Sedative, hypnotic or anxiolytic dependence, in remission: Secondary | ICD-10-CM

## 2019-07-09 NOTE — Progress Notes (Signed)
Virtual Visit via Video Note  I connected with April Oliver on 07/09/19 at 10:30 AM EST by a video enabled telemedicine application and verified that I am speaking with the correct person using two identifiers.   I discussed the limitations of evaluation and management by telemedicine and the availability of in person appointments. The patient expressed understanding and agreed to proceed.     I discussed the assessment and treatment plan with the patient. The patient was provided an opportunity to ask questions and all were answered. The patient agreed with the plan and demonstrated an understanding of the instructions.   The patient was advised to call back or seek an in-person evaluation if the symptoms worsen or if the condition fails to improve as anticipated.  [place] MD OP Progress Note  07/09/2019 12:01 PM April Oliver  MRN:  [id number]  Chief Complaint:  Chief Complaint    Follow-up     HPI: April Oliver is an 85 year old Caucasian female, retired, lives in [place], has a history of GAD, chronic pain, hypertension, hypothyroidism, insomnia, benzodiazepine dependence currently in remission was evaluated by telemedicine today.  Collateral information was obtained from daughter-April Oliver.  Patient today reports her anxiety symptoms are under control on the current medication regimen.  She completely tapered herself off of the Klonopin.  She denies any withdrawal symptoms and reports she is doing okay.  She continues to take Seroquel and trazodone at bedtime.  She reports the medications is effective.  She denies side effects.  Patient reports she was diagnosed with basal cell carcinoma of her right ear recently.  She is scheduled to get surgery soon.  Patient continues to have good support system from her daughter.  Patient denies any other concerns today. Visit Diagnosis:    ICD-10-CM   1. GAD (generalized anxiety disorder)  F41.1    stable  2. Insomnia due to medical condition   G47.01   3. Benzodiazepine dependence in remission ([place])  F13.21     Past Psychiatric History: Reviewed past psychiatric history from my progress note on 02/14/2019.  Past trials of Ambien, trazodone, Klonopin, BuSpar  Past Medical History:  Past Medical History:  Diagnosis Date  . Basal cell carcinoma   . Chronic back pain   . Chronic sciatica 03/11/2014  . Gonalgia 06/10/2014  . Hypertension   . Hypothyroidism   . Low back pain with sciatica 05/14/2014  . MVP (mitral valve prolapse)   . Scoliosis     Past Surgical History:  Procedure Laterality Date  . APPENDECTOMY    . BACK SURGERY     x 5  . CATARACT EXTRACTION Bilateral   . JOINT REPLACEMENT Bilateral    knee  . NECK SURGERY     x 2  . ROTATOR CUFF REPAIR Left     Family Psychiatric History: Reviewed family psychiatric history from my progress note on 02/14/2019.  Family History:  Family History  Problem Relation Age of Onset  . Mental illness Neg Hx     Social History: Reviewed social history from my progress note on 02/14/2019. Social History   Socioeconomic History  . Marital status: Widowed    Spouse name: Not on file  . Number of children: 4  . Years of education: Not on file  . Highest education level: Not on file  Occupational History  . Not on file  Social Needs  . Financial resource strain: Not on file  . Food insecurity    Worry: Not on file  Inability: Not on file  . Transportation needs    Medical: Not on file    Non-medical: Not on file  Tobacco Use  . Smoking status: Former Smoker    Packs/day: 0.25    Types: Cigarettes    Quit date: 08/2018    Years since quitting: 0.8  . Smokeless tobacco: Never Used  Substance and Sexual Activity  . Alcohol use: Yes    Alcohol/week: 7.0 standard drinks    Types: 7 Glasses of wine per week    Comment: glass of wine daily  . Drug use: Not on file  . Sexual activity: Not on file  Lifestyle  . Physical activity    Days per week: Not on file     Minutes per session: Not on file  . Stress: Not on file  Relationships  . Social Herbalist on phone: Not on file    Gets together: Not on file    Attends religious service: Not on file    Active member of club or organization: Not on file    Attends meetings of clubs or organizations: Not on file    Relationship status: Not on file  Other Topics Concern  . Not on file  Social History Narrative  . Not on file    Allergies:  Allergies  Allergen Reactions  . Ambien [Zolpidem] Other (See Comments)    Other reaction(s): Altered mental status (finding) hallucinations  . Morphine And Related Other (See Comments)    hyper    Metabolic Disorder Labs: No results found for: HGBA1C, MPG No results found for: PROLACTIN No results found for: CHOL, TRIG, HDL, CHOLHDL, VLDL, LDLCALC No results found for: TSH  Therapeutic Level Labs: No results found for: LITHIUM No results found for: VALPROATE No components found for:  CBMZ  Current Medications: Current Outpatient Medications  Medication Sig Dispense Refill  . budesonide-formoterol (SYMBICORT) 160-4.5 MCG/ACT inhaler Inhale into the lungs.    . Cholecalciferol (VITAMIN D3) 25 MCG (1000 UT) CAPS Take by mouth.    . ciclopirox (LOPROX) 0.77 % cream     . Multiple Vitamins-Minerals (CENTRUM SILVER PO) Take by mouth.    . potassium chloride (K-DUR) 10 MEQ tablet TAKE 1 TABLET BY MOUTH ONCE DAILY WITH LASIX    . QUEtiapine (SEROQUEL) 25 MG tablet TAKE 1 AND 1/2 TABLETS BY MOUTH AT BEDTIME FOR SLEEP AND ANXIETY 45 tablet 1  . traZODone (DESYREL) 100 MG tablet Take 2 tablets (200 mg total) by mouth at bedtime as needed for sleep. 180 tablet 1  . triamcinolone ointment (KENALOG) 0.1 %     . Zinc 50 MG TABS Take by mouth.    Marland Kitchen albuterol (VENTOLIN HFA) 108 (90 Base) MCG/ACT inhaler INHALE 2 INHALATIONS BY MOUTH INTO THE LUNGS EVERY 6 HOURS AS NEEDED FOR WHEEZING    . albuterol-ipratropium (COMBIVENT) 18-103 MCG/ACT inhaler 18-103  mcg/Actuation    . benzonatate (TESSALON) 200 MG capsule TAKE 1 CAPSULE BY MOUTH 3 TIMES A DAY AS NEEDED FOR COUGH FOR UP TO 7DAYS.    Marland Kitchen diclofenac (VOLTAREN) 75 MG EC tablet Take 1 tablet (75 mg total) by mouth 2 (two) times daily. (Patient not taking: Reported on 07/09/2019) 30 tablet 0  . furosemide (LASIX) 40 MG tablet     . gabapentin (NEURONTIN) 300 MG capsule 800 mg.    . gabapentin (NEURONTIN) 800 MG tablet     . levothyroxine (SYNTHROID, LEVOTHROID) 75 MCG tablet     . Melatonin 3  MG TABS Take by mouth.    . meloxicam (MOBIC) 15 MG tablet Take 1 tablet (15 mg total) by mouth daily. 30 tablet 3  . metoprolol tartrate (LOPRESSOR) 25 MG tablet     . tiZANidine (ZANAFLEX) 4 MG tablet     . traMADol (ULTRAM) 50 MG tablet TAKE 1 TO 2 TABLETS BY MOUTH EVERY 8 HOURS AS NEEDED FOR PAIN     Current Facility-Administered Medications  Medication Dose Route Frequency Provider Last Rate Last Dose  . triamcinolone acetonide (KENALOG) 10 MG/ML injection 10 mg  10 mg Other Once Wallene Huh, DPM         Musculoskeletal: Strength & Muscle Tone: UTA Gait & Station: Uses a walker at times Patient leans: N/A  Psychiatric Specialty Exam: Review of Systems  Psychiatric/Behavioral: Negative for depression, hallucinations, substance abuse and suicidal ideas. The patient is not nervous/anxious and does not have insomnia.   All other systems reviewed and are negative.   There were no vitals taken for this visit.There is no height or weight on file to calculate BMI.  General Appearance: Casual  Eye Contact:  Fair  Speech:  Clear and Coherent  Volume:  Normal  Mood:  Euthymic  Affect:  Congruent  Thought Process:  Goal Directed and Descriptions of Associations: Intact  Orientation:  Full (Time, Place, and Person)  Thought Content: Logical   Suicidal Thoughts:  No  Homicidal Thoughts:  No  Memory:  Immediate;   Fair Recent;   Fair Remote;   Fair  Judgement:  Fair  Insight:  Fair   Psychomotor Activity:  Normal  Concentration:  Concentration: Fair and Attention Span: Fair  Recall:  [employer] of Knowledge: Fair  Language: Fair  Akathisia:  No  Handed:  Right  AIMS (if indicated): denies tremors, rigidity  Assets:  Communication Skills Desire for Improvement Housing Social Support Transportation  ADL's:  Intact  Cognition: WNL  Sleep:  Fair   Screenings:   Assessment and Plan: Gagen is an 85 year old Caucasian female, retired, widowed, lives in [place], has a history of anxiety disorder, insomnia, chronic pain, hypothyroidism, hypertension, benzodiazepine dependence in remission was evaluated by telemedicine today.  Patient is currently making progress on the current medication regimen.  Plan as noted below.  Plan GAD-stable Patient is completely off of clonazepam. Continue Seroquel 37.5 mg p.o. nightly  Insomnia-stable Seroquel 37.5 mg p.o. nightly Trazodone 200 mg p.o. nightly  Benzodiazepine dependence in remission Patient completely tapered off per PMD  Collateral information was obtained from daughter-April Oliver who reports patient is making progress.  We will request medical records from her dermatologist-patient to sign consent.  Follow-up in clinic in 3 months or sooner if needed.  February 3 at 11 AM  I have spent atleast 15 minutes non face to face with patient today. More than 50 % of the time was spent for psychoeducation and supportive psychotherapy and care coordination. This note was generated in part or whole with voice recognition software. Voice recognition is usually quite accurate but there are transcription errors that can and very often do occur. I apologize for any typographical errors that were not detected and corrected.       Ursula Alert, MD 07/09/2019, 12:01 PM

## 2019-07-30 DIAGNOSIS — C44212 Basal cell carcinoma of skin of right ear and external auricular canal: Secondary | ICD-10-CM | POA: Diagnosis not present

## 2019-07-31 ENCOUNTER — Telehealth: Payer: Self-pay

## 2019-07-31 NOTE — Telephone Encounter (Signed)
Returned call to patient.  She reports she is gaining weight on the Seroquel and wonders whether if she can come off of it.  She reports she is not anxious and she has trazodone and melatonin for her sleep problems.  She does not know why she is on Seroquel at this time.  Advised patient to gradually come off of it.  Advised her to take it as needed for the next few weeks.  Advised her to be more active and also watch her diet.

## 2019-07-31 NOTE — Telephone Encounter (Signed)
pt called states she has been gaining weight and she feels like it is the seroquel.

## 2019-08-15 ENCOUNTER — Other Ambulatory Visit: Payer: Self-pay | Admitting: Psychiatry

## 2019-08-15 DIAGNOSIS — F419 Anxiety disorder, unspecified: Secondary | ICD-10-CM

## 2019-08-15 DIAGNOSIS — G4701 Insomnia due to medical condition: Secondary | ICD-10-CM

## 2019-09-04 DIAGNOSIS — S01301D Unspecified open wound of right ear, subsequent encounter: Secondary | ICD-10-CM | POA: Diagnosis not present

## 2019-09-04 DIAGNOSIS — Z48817 Encounter for surgical aftercare following surgery on the skin and subcutaneous tissue: Secondary | ICD-10-CM | POA: Diagnosis not present

## 2019-09-16 ENCOUNTER — Other Ambulatory Visit: Payer: Self-pay | Admitting: Psychiatry

## 2019-10-02 ENCOUNTER — Ambulatory Visit: Payer: Medicare HMO | Admitting: Psychiatry

## 2019-10-02 ENCOUNTER — Other Ambulatory Visit: Payer: Self-pay

## 2019-10-02 DIAGNOSIS — S01301D Unspecified open wound of right ear, subsequent encounter: Secondary | ICD-10-CM | POA: Diagnosis not present

## 2019-11-06 ENCOUNTER — Other Ambulatory Visit: Payer: Self-pay | Admitting: Psychiatry

## 2019-11-06 DIAGNOSIS — G4701 Insomnia due to medical condition: Secondary | ICD-10-CM

## 2019-11-06 DIAGNOSIS — F419 Anxiety disorder, unspecified: Secondary | ICD-10-CM

## 2019-11-27 DIAGNOSIS — E039 Hypothyroidism, unspecified: Secondary | ICD-10-CM | POA: Diagnosis not present

## 2019-11-27 DIAGNOSIS — H43813 Vitreous degeneration, bilateral: Secondary | ICD-10-CM | POA: Diagnosis not present

## 2019-11-27 DIAGNOSIS — Z79899 Other long term (current) drug therapy: Secondary | ICD-10-CM | POA: Diagnosis not present

## 2019-11-27 DIAGNOSIS — E785 Hyperlipidemia, unspecified: Secondary | ICD-10-CM | POA: Diagnosis not present

## 2019-11-27 DIAGNOSIS — I10 Essential (primary) hypertension: Secondary | ICD-10-CM | POA: Diagnosis not present

## 2019-11-27 DIAGNOSIS — N1832 Chronic kidney disease, stage 3b: Secondary | ICD-10-CM | POA: Diagnosis not present

## 2019-12-06 DIAGNOSIS — F17210 Nicotine dependence, cigarettes, uncomplicated: Secondary | ICD-10-CM | POA: Diagnosis not present

## 2019-12-06 DIAGNOSIS — N1832 Chronic kidney disease, stage 3b: Secondary | ICD-10-CM | POA: Diagnosis not present

## 2019-12-06 DIAGNOSIS — M542 Cervicalgia: Secondary | ICD-10-CM | POA: Diagnosis not present

## 2019-12-06 DIAGNOSIS — Z79899 Other long term (current) drug therapy: Secondary | ICD-10-CM | POA: Diagnosis not present

## 2019-12-06 DIAGNOSIS — M5416 Radiculopathy, lumbar region: Secondary | ICD-10-CM | POA: Diagnosis not present

## 2019-12-06 DIAGNOSIS — E039 Hypothyroidism, unspecified: Secondary | ICD-10-CM | POA: Diagnosis not present

## 2019-12-06 DIAGNOSIS — M48061 Spinal stenosis, lumbar region without neurogenic claudication: Secondary | ICD-10-CM | POA: Diagnosis not present

## 2019-12-06 DIAGNOSIS — F325 Major depressive disorder, single episode, in full remission: Secondary | ICD-10-CM | POA: Diagnosis not present

## 2019-12-06 DIAGNOSIS — I129 Hypertensive chronic kidney disease with stage 1 through stage 4 chronic kidney disease, or unspecified chronic kidney disease: Secondary | ICD-10-CM | POA: Diagnosis not present

## 2019-12-11 DIAGNOSIS — M48061 Spinal stenosis, lumbar region without neurogenic claudication: Secondary | ICD-10-CM | POA: Diagnosis not present

## 2019-12-11 DIAGNOSIS — M1991 Primary osteoarthritis, unspecified site: Secondary | ICD-10-CM | POA: Diagnosis not present

## 2019-12-11 DIAGNOSIS — M810 Age-related osteoporosis without current pathological fracture: Secondary | ICD-10-CM | POA: Diagnosis not present

## 2019-12-11 DIAGNOSIS — I129 Hypertensive chronic kidney disease with stage 1 through stage 4 chronic kidney disease, or unspecified chronic kidney disease: Secondary | ICD-10-CM | POA: Diagnosis not present

## 2019-12-11 DIAGNOSIS — F329 Major depressive disorder, single episode, unspecified: Secondary | ICD-10-CM | POA: Diagnosis not present

## 2019-12-11 DIAGNOSIS — M47812 Spondylosis without myelopathy or radiculopathy, cervical region: Secondary | ICD-10-CM | POA: Diagnosis not present

## 2019-12-11 DIAGNOSIS — M419 Scoliosis, unspecified: Secondary | ICD-10-CM | POA: Diagnosis not present

## 2019-12-11 DIAGNOSIS — M4726 Other spondylosis with radiculopathy, lumbar region: Secondary | ICD-10-CM | POA: Diagnosis not present

## 2019-12-11 DIAGNOSIS — N183 Chronic kidney disease, stage 3 unspecified: Secondary | ICD-10-CM | POA: Diagnosis not present

## 2019-12-16 DIAGNOSIS — M1991 Primary osteoarthritis, unspecified site: Secondary | ICD-10-CM | POA: Diagnosis not present

## 2019-12-16 DIAGNOSIS — M4726 Other spondylosis with radiculopathy, lumbar region: Secondary | ICD-10-CM | POA: Diagnosis not present

## 2019-12-16 DIAGNOSIS — M48061 Spinal stenosis, lumbar region without neurogenic claudication: Secondary | ICD-10-CM | POA: Diagnosis not present

## 2019-12-16 DIAGNOSIS — M47812 Spondylosis without myelopathy or radiculopathy, cervical region: Secondary | ICD-10-CM | POA: Diagnosis not present

## 2019-12-16 DIAGNOSIS — I129 Hypertensive chronic kidney disease with stage 1 through stage 4 chronic kidney disease, or unspecified chronic kidney disease: Secondary | ICD-10-CM | POA: Diagnosis not present

## 2019-12-16 DIAGNOSIS — F329 Major depressive disorder, single episode, unspecified: Secondary | ICD-10-CM | POA: Diagnosis not present

## 2019-12-16 DIAGNOSIS — M810 Age-related osteoporosis without current pathological fracture: Secondary | ICD-10-CM | POA: Diagnosis not present

## 2019-12-16 DIAGNOSIS — N183 Chronic kidney disease, stage 3 unspecified: Secondary | ICD-10-CM | POA: Diagnosis not present

## 2019-12-16 DIAGNOSIS — M419 Scoliosis, unspecified: Secondary | ICD-10-CM | POA: Diagnosis not present

## 2019-12-18 DIAGNOSIS — M4726 Other spondylosis with radiculopathy, lumbar region: Secondary | ICD-10-CM | POA: Diagnosis not present

## 2019-12-18 DIAGNOSIS — M419 Scoliosis, unspecified: Secondary | ICD-10-CM | POA: Diagnosis not present

## 2019-12-18 DIAGNOSIS — N183 Chronic kidney disease, stage 3 unspecified: Secondary | ICD-10-CM | POA: Diagnosis not present

## 2019-12-18 DIAGNOSIS — M47812 Spondylosis without myelopathy or radiculopathy, cervical region: Secondary | ICD-10-CM | POA: Diagnosis not present

## 2019-12-18 DIAGNOSIS — M1991 Primary osteoarthritis, unspecified site: Secondary | ICD-10-CM | POA: Diagnosis not present

## 2019-12-18 DIAGNOSIS — F329 Major depressive disorder, single episode, unspecified: Secondary | ICD-10-CM | POA: Diagnosis not present

## 2019-12-18 DIAGNOSIS — I129 Hypertensive chronic kidney disease with stage 1 through stage 4 chronic kidney disease, or unspecified chronic kidney disease: Secondary | ICD-10-CM | POA: Diagnosis not present

## 2019-12-18 DIAGNOSIS — M48061 Spinal stenosis, lumbar region without neurogenic claudication: Secondary | ICD-10-CM | POA: Diagnosis not present

## 2019-12-18 DIAGNOSIS — M810 Age-related osteoporosis without current pathological fracture: Secondary | ICD-10-CM | POA: Diagnosis not present

## 2019-12-24 DIAGNOSIS — N183 Chronic kidney disease, stage 3 unspecified: Secondary | ICD-10-CM | POA: Diagnosis not present

## 2019-12-24 DIAGNOSIS — M419 Scoliosis, unspecified: Secondary | ICD-10-CM | POA: Diagnosis not present

## 2019-12-24 DIAGNOSIS — I129 Hypertensive chronic kidney disease with stage 1 through stage 4 chronic kidney disease, or unspecified chronic kidney disease: Secondary | ICD-10-CM | POA: Diagnosis not present

## 2019-12-24 DIAGNOSIS — M810 Age-related osteoporosis without current pathological fracture: Secondary | ICD-10-CM | POA: Diagnosis not present

## 2019-12-24 DIAGNOSIS — F329 Major depressive disorder, single episode, unspecified: Secondary | ICD-10-CM | POA: Diagnosis not present

## 2019-12-24 DIAGNOSIS — M4726 Other spondylosis with radiculopathy, lumbar region: Secondary | ICD-10-CM | POA: Diagnosis not present

## 2019-12-24 DIAGNOSIS — M47812 Spondylosis without myelopathy or radiculopathy, cervical region: Secondary | ICD-10-CM | POA: Diagnosis not present

## 2019-12-24 DIAGNOSIS — M1991 Primary osteoarthritis, unspecified site: Secondary | ICD-10-CM | POA: Diagnosis not present

## 2019-12-24 DIAGNOSIS — M48061 Spinal stenosis, lumbar region without neurogenic claudication: Secondary | ICD-10-CM | POA: Diagnosis not present

## 2019-12-26 DIAGNOSIS — N183 Chronic kidney disease, stage 3 unspecified: Secondary | ICD-10-CM | POA: Diagnosis not present

## 2019-12-26 DIAGNOSIS — M1991 Primary osteoarthritis, unspecified site: Secondary | ICD-10-CM | POA: Diagnosis not present

## 2019-12-26 DIAGNOSIS — M48061 Spinal stenosis, lumbar region without neurogenic claudication: Secondary | ICD-10-CM | POA: Diagnosis not present

## 2019-12-26 DIAGNOSIS — M810 Age-related osteoporosis without current pathological fracture: Secondary | ICD-10-CM | POA: Diagnosis not present

## 2019-12-26 DIAGNOSIS — M419 Scoliosis, unspecified: Secondary | ICD-10-CM | POA: Diagnosis not present

## 2019-12-26 DIAGNOSIS — M4726 Other spondylosis with radiculopathy, lumbar region: Secondary | ICD-10-CM | POA: Diagnosis not present

## 2019-12-26 DIAGNOSIS — M47812 Spondylosis without myelopathy or radiculopathy, cervical region: Secondary | ICD-10-CM | POA: Diagnosis not present

## 2019-12-26 DIAGNOSIS — F329 Major depressive disorder, single episode, unspecified: Secondary | ICD-10-CM | POA: Diagnosis not present

## 2019-12-26 DIAGNOSIS — I129 Hypertensive chronic kidney disease with stage 1 through stage 4 chronic kidney disease, or unspecified chronic kidney disease: Secondary | ICD-10-CM | POA: Diagnosis not present

## 2020-01-01 DIAGNOSIS — M419 Scoliosis, unspecified: Secondary | ICD-10-CM | POA: Diagnosis not present

## 2020-01-01 DIAGNOSIS — I129 Hypertensive chronic kidney disease with stage 1 through stage 4 chronic kidney disease, or unspecified chronic kidney disease: Secondary | ICD-10-CM | POA: Diagnosis not present

## 2020-01-01 DIAGNOSIS — M4726 Other spondylosis with radiculopathy, lumbar region: Secondary | ICD-10-CM | POA: Diagnosis not present

## 2020-01-01 DIAGNOSIS — N183 Chronic kidney disease, stage 3 unspecified: Secondary | ICD-10-CM | POA: Diagnosis not present

## 2020-01-01 DIAGNOSIS — M1991 Primary osteoarthritis, unspecified site: Secondary | ICD-10-CM | POA: Diagnosis not present

## 2020-01-01 DIAGNOSIS — F329 Major depressive disorder, single episode, unspecified: Secondary | ICD-10-CM | POA: Diagnosis not present

## 2020-01-01 DIAGNOSIS — M47812 Spondylosis without myelopathy or radiculopathy, cervical region: Secondary | ICD-10-CM | POA: Diagnosis not present

## 2020-01-01 DIAGNOSIS — M810 Age-related osteoporosis without current pathological fracture: Secondary | ICD-10-CM | POA: Diagnosis not present

## 2020-01-01 DIAGNOSIS — M48061 Spinal stenosis, lumbar region without neurogenic claudication: Secondary | ICD-10-CM | POA: Diagnosis not present

## 2020-01-03 DIAGNOSIS — M48061 Spinal stenosis, lumbar region without neurogenic claudication: Secondary | ICD-10-CM | POA: Diagnosis not present

## 2020-01-03 DIAGNOSIS — N183 Chronic kidney disease, stage 3 unspecified: Secondary | ICD-10-CM | POA: Diagnosis not present

## 2020-01-03 DIAGNOSIS — M47812 Spondylosis without myelopathy or radiculopathy, cervical region: Secondary | ICD-10-CM | POA: Diagnosis not present

## 2020-01-03 DIAGNOSIS — M4726 Other spondylosis with radiculopathy, lumbar region: Secondary | ICD-10-CM | POA: Diagnosis not present

## 2020-01-03 DIAGNOSIS — I129 Hypertensive chronic kidney disease with stage 1 through stage 4 chronic kidney disease, or unspecified chronic kidney disease: Secondary | ICD-10-CM | POA: Diagnosis not present

## 2020-01-03 DIAGNOSIS — F329 Major depressive disorder, single episode, unspecified: Secondary | ICD-10-CM | POA: Diagnosis not present

## 2020-01-03 DIAGNOSIS — M419 Scoliosis, unspecified: Secondary | ICD-10-CM | POA: Diagnosis not present

## 2020-01-03 DIAGNOSIS — M1991 Primary osteoarthritis, unspecified site: Secondary | ICD-10-CM | POA: Diagnosis not present

## 2020-01-03 DIAGNOSIS — M810 Age-related osteoporosis without current pathological fracture: Secondary | ICD-10-CM | POA: Diagnosis not present

## 2020-01-07 DIAGNOSIS — M48061 Spinal stenosis, lumbar region without neurogenic claudication: Secondary | ICD-10-CM | POA: Diagnosis not present

## 2020-01-07 DIAGNOSIS — M1991 Primary osteoarthritis, unspecified site: Secondary | ICD-10-CM | POA: Diagnosis not present

## 2020-01-07 DIAGNOSIS — I129 Hypertensive chronic kidney disease with stage 1 through stage 4 chronic kidney disease, or unspecified chronic kidney disease: Secondary | ICD-10-CM | POA: Diagnosis not present

## 2020-01-07 DIAGNOSIS — N183 Chronic kidney disease, stage 3 unspecified: Secondary | ICD-10-CM | POA: Diagnosis not present

## 2020-01-07 DIAGNOSIS — M419 Scoliosis, unspecified: Secondary | ICD-10-CM | POA: Diagnosis not present

## 2020-01-07 DIAGNOSIS — M47812 Spondylosis without myelopathy or radiculopathy, cervical region: Secondary | ICD-10-CM | POA: Diagnosis not present

## 2020-01-07 DIAGNOSIS — M4726 Other spondylosis with radiculopathy, lumbar region: Secondary | ICD-10-CM | POA: Diagnosis not present

## 2020-01-07 DIAGNOSIS — M810 Age-related osteoporosis without current pathological fracture: Secondary | ICD-10-CM | POA: Diagnosis not present

## 2020-01-07 DIAGNOSIS — F329 Major depressive disorder, single episode, unspecified: Secondary | ICD-10-CM | POA: Diagnosis not present

## 2020-01-10 DIAGNOSIS — M4726 Other spondylosis with radiculopathy, lumbar region: Secondary | ICD-10-CM | POA: Diagnosis not present

## 2020-01-10 DIAGNOSIS — N183 Chronic kidney disease, stage 3 unspecified: Secondary | ICD-10-CM | POA: Diagnosis not present

## 2020-01-10 DIAGNOSIS — M48061 Spinal stenosis, lumbar region without neurogenic claudication: Secondary | ICD-10-CM | POA: Diagnosis not present

## 2020-01-10 DIAGNOSIS — I129 Hypertensive chronic kidney disease with stage 1 through stage 4 chronic kidney disease, or unspecified chronic kidney disease: Secondary | ICD-10-CM | POA: Diagnosis not present

## 2020-01-10 DIAGNOSIS — M810 Age-related osteoporosis without current pathological fracture: Secondary | ICD-10-CM | POA: Diagnosis not present

## 2020-01-10 DIAGNOSIS — F329 Major depressive disorder, single episode, unspecified: Secondary | ICD-10-CM | POA: Diagnosis not present

## 2020-01-10 DIAGNOSIS — M419 Scoliosis, unspecified: Secondary | ICD-10-CM | POA: Diagnosis not present

## 2020-01-10 DIAGNOSIS — M47812 Spondylosis without myelopathy or radiculopathy, cervical region: Secondary | ICD-10-CM | POA: Diagnosis not present

## 2020-01-10 DIAGNOSIS — M1991 Primary osteoarthritis, unspecified site: Secondary | ICD-10-CM | POA: Diagnosis not present

## 2020-01-15 DIAGNOSIS — M4726 Other spondylosis with radiculopathy, lumbar region: Secondary | ICD-10-CM | POA: Diagnosis not present

## 2020-01-15 DIAGNOSIS — I129 Hypertensive chronic kidney disease with stage 1 through stage 4 chronic kidney disease, or unspecified chronic kidney disease: Secondary | ICD-10-CM | POA: Diagnosis not present

## 2020-01-15 DIAGNOSIS — M419 Scoliosis, unspecified: Secondary | ICD-10-CM | POA: Diagnosis not present

## 2020-01-15 DIAGNOSIS — M810 Age-related osteoporosis without current pathological fracture: Secondary | ICD-10-CM | POA: Diagnosis not present

## 2020-01-15 DIAGNOSIS — M47812 Spondylosis without myelopathy or radiculopathy, cervical region: Secondary | ICD-10-CM | POA: Diagnosis not present

## 2020-01-15 DIAGNOSIS — M1991 Primary osteoarthritis, unspecified site: Secondary | ICD-10-CM | POA: Diagnosis not present

## 2020-01-15 DIAGNOSIS — M48061 Spinal stenosis, lumbar region without neurogenic claudication: Secondary | ICD-10-CM | POA: Diagnosis not present

## 2020-01-16 DIAGNOSIS — F329 Major depressive disorder, single episode, unspecified: Secondary | ICD-10-CM | POA: Diagnosis not present

## 2020-01-16 DIAGNOSIS — M4726 Other spondylosis with radiculopathy, lumbar region: Secondary | ICD-10-CM | POA: Diagnosis not present

## 2020-01-16 DIAGNOSIS — M1991 Primary osteoarthritis, unspecified site: Secondary | ICD-10-CM | POA: Diagnosis not present

## 2020-01-16 DIAGNOSIS — I129 Hypertensive chronic kidney disease with stage 1 through stage 4 chronic kidney disease, or unspecified chronic kidney disease: Secondary | ICD-10-CM | POA: Diagnosis not present

## 2020-01-16 DIAGNOSIS — M419 Scoliosis, unspecified: Secondary | ICD-10-CM | POA: Diagnosis not present

## 2020-01-16 DIAGNOSIS — M48061 Spinal stenosis, lumbar region without neurogenic claudication: Secondary | ICD-10-CM | POA: Diagnosis not present

## 2020-01-16 DIAGNOSIS — M810 Age-related osteoporosis without current pathological fracture: Secondary | ICD-10-CM | POA: Diagnosis not present

## 2020-01-16 DIAGNOSIS — M47812 Spondylosis without myelopathy or radiculopathy, cervical region: Secondary | ICD-10-CM | POA: Diagnosis not present

## 2020-01-16 DIAGNOSIS — N183 Chronic kidney disease, stage 3 unspecified: Secondary | ICD-10-CM | POA: Diagnosis not present

## 2020-01-23 DIAGNOSIS — M419 Scoliosis, unspecified: Secondary | ICD-10-CM | POA: Diagnosis not present

## 2020-01-23 DIAGNOSIS — M1991 Primary osteoarthritis, unspecified site: Secondary | ICD-10-CM | POA: Diagnosis not present

## 2020-01-23 DIAGNOSIS — F329 Major depressive disorder, single episode, unspecified: Secondary | ICD-10-CM | POA: Diagnosis not present

## 2020-01-23 DIAGNOSIS — M48061 Spinal stenosis, lumbar region without neurogenic claudication: Secondary | ICD-10-CM | POA: Diagnosis not present

## 2020-01-23 DIAGNOSIS — M4726 Other spondylosis with radiculopathy, lumbar region: Secondary | ICD-10-CM | POA: Diagnosis not present

## 2020-01-23 DIAGNOSIS — I129 Hypertensive chronic kidney disease with stage 1 through stage 4 chronic kidney disease, or unspecified chronic kidney disease: Secondary | ICD-10-CM | POA: Diagnosis not present

## 2020-01-23 DIAGNOSIS — N183 Chronic kidney disease, stage 3 unspecified: Secondary | ICD-10-CM | POA: Diagnosis not present

## 2020-01-23 DIAGNOSIS — M810 Age-related osteoporosis without current pathological fracture: Secondary | ICD-10-CM | POA: Diagnosis not present

## 2020-01-23 DIAGNOSIS — M47812 Spondylosis without myelopathy or radiculopathy, cervical region: Secondary | ICD-10-CM | POA: Diagnosis not present

## 2020-01-24 DIAGNOSIS — E039 Hypothyroidism, unspecified: Secondary | ICD-10-CM | POA: Diagnosis not present

## 2020-01-24 DIAGNOSIS — N1832 Chronic kidney disease, stage 3b: Secondary | ICD-10-CM | POA: Diagnosis not present

## 2020-01-24 DIAGNOSIS — M5416 Radiculopathy, lumbar region: Secondary | ICD-10-CM | POA: Diagnosis not present

## 2020-01-24 DIAGNOSIS — F329 Major depressive disorder, single episode, unspecified: Secondary | ICD-10-CM | POA: Diagnosis not present

## 2020-01-24 DIAGNOSIS — I129 Hypertensive chronic kidney disease with stage 1 through stage 4 chronic kidney disease, or unspecified chronic kidney disease: Secondary | ICD-10-CM | POA: Diagnosis not present

## 2020-01-24 DIAGNOSIS — F17210 Nicotine dependence, cigarettes, uncomplicated: Secondary | ICD-10-CM | POA: Diagnosis not present

## 2020-01-24 DIAGNOSIS — M48061 Spinal stenosis, lumbar region without neurogenic claudication: Secondary | ICD-10-CM | POA: Diagnosis not present

## 2020-01-24 DIAGNOSIS — F419 Anxiety disorder, unspecified: Secondary | ICD-10-CM | POA: Diagnosis not present

## 2020-01-30 DIAGNOSIS — M810 Age-related osteoporosis without current pathological fracture: Secondary | ICD-10-CM | POA: Diagnosis not present

## 2020-01-30 DIAGNOSIS — M4726 Other spondylosis with radiculopathy, lumbar region: Secondary | ICD-10-CM | POA: Diagnosis not present

## 2020-01-30 DIAGNOSIS — N183 Chronic kidney disease, stage 3 unspecified: Secondary | ICD-10-CM | POA: Diagnosis not present

## 2020-01-30 DIAGNOSIS — H8112 Benign paroxysmal vertigo, left ear: Secondary | ICD-10-CM | POA: Diagnosis not present

## 2020-01-30 DIAGNOSIS — F329 Major depressive disorder, single episode, unspecified: Secondary | ICD-10-CM | POA: Diagnosis not present

## 2020-01-30 DIAGNOSIS — M419 Scoliosis, unspecified: Secondary | ICD-10-CM | POA: Diagnosis not present

## 2020-01-30 DIAGNOSIS — H6123 Impacted cerumen, bilateral: Secondary | ICD-10-CM | POA: Diagnosis not present

## 2020-01-30 DIAGNOSIS — M1991 Primary osteoarthritis, unspecified site: Secondary | ICD-10-CM | POA: Diagnosis not present

## 2020-01-30 DIAGNOSIS — M47812 Spondylosis without myelopathy or radiculopathy, cervical region: Secondary | ICD-10-CM | POA: Diagnosis not present

## 2020-01-30 DIAGNOSIS — M48061 Spinal stenosis, lumbar region without neurogenic claudication: Secondary | ICD-10-CM | POA: Diagnosis not present

## 2020-01-30 DIAGNOSIS — I129 Hypertensive chronic kidney disease with stage 1 through stage 4 chronic kidney disease, or unspecified chronic kidney disease: Secondary | ICD-10-CM | POA: Diagnosis not present

## 2020-02-03 DIAGNOSIS — H8111 Benign paroxysmal vertigo, right ear: Secondary | ICD-10-CM | POA: Diagnosis not present

## 2020-02-18 ENCOUNTER — Encounter: Payer: Medicare HMO | Admitting: Physical Therapy

## 2020-03-19 ENCOUNTER — Other Ambulatory Visit: Payer: Self-pay | Admitting: Psychiatry

## 2020-03-23 ENCOUNTER — Telehealth: Payer: Self-pay

## 2020-03-23 NOTE — Telephone Encounter (Signed)
Medication problems- patient called stating she was unsure why Dr. Pricilla Larsson filled a prescription for Trazodone for her.  Discussed he had filled this based on a past order from Dr. Shea Evans with limited supply and request she make a new appointment if she was returning to this office to see Dr. Shea Evans.  Patient stated she no longer "needs a psychiatrist" and stated she sees Dr. Caryl Comes to manage her medications now.  Patient agreed with plan to call and follow up with Dr. Olin Pia office to see if they wanted to continue the medication, to stop it or taper down as she just wants to see them at this time for care.  Patient to call back if any further concerns or issues with medication.

## 2020-03-25 NOTE — Telephone Encounter (Signed)
pt called stated that she only received 20 pills and she needed a refill on her trazodone.  pt was told that she needed to make an appt. because last time she was seen was 07-09-19.  pt stated that she did not want to see a psychiatrist and she just wanted the medication.  Pt was told several times that she would need to make an appt and that she would be considered a new pt because it has been longer than 6 months since she was seen.  Pt states that she doesn't need to see Dr. Shea Evans that her back is what is hurting .  I suggested her to go the the pain clinic and she refused stated that they dont help.  Pt states that she needs to give me my medications.  I told her again that she would need to make an appt.  She still refused.  Pt wants to hear for Dr. Shea Evans directly.  Pt was also advised that if she did not want to see dr Shea Evans than she could asked her primary care to take over giving her refills. Pt refused to that also. She doesn't understand that you have not seen her almost 8 months and that you cant give her medication with evaluating her.

## 2020-03-25 NOTE — Telephone Encounter (Signed)
Yes I agree . She needs to be evaluated since she has not been seen in a long time. Or she could ask her Primary provider . Thanks

## 2020-03-25 NOTE — Telephone Encounter (Signed)
called pt back told her the same thing that she would need to make an appt to be seen if she wanted anymore medications. if not she could contact her pcp and ask him if he would give her the medcations. pt was upset that dr. Shea Evans would not refill her medication even thou she doesn't feel like she needs to be seeing a psychiatrist.  Pt states she would think about it an call tomorrow .

## 2020-04-02 DIAGNOSIS — J45901 Unspecified asthma with (acute) exacerbation: Secondary | ICD-10-CM | POA: Diagnosis not present

## 2020-04-13 DIAGNOSIS — I872 Venous insufficiency (chronic) (peripheral): Secondary | ICD-10-CM | POA: Diagnosis not present

## 2020-04-13 DIAGNOSIS — J449 Chronic obstructive pulmonary disease, unspecified: Secondary | ICD-10-CM | POA: Diagnosis not present

## 2020-04-13 DIAGNOSIS — R21 Rash and other nonspecific skin eruption: Secondary | ICD-10-CM | POA: Diagnosis not present

## 2020-04-21 DIAGNOSIS — M62830 Muscle spasm of back: Secondary | ICD-10-CM | POA: Diagnosis not present

## 2020-04-21 DIAGNOSIS — M5416 Radiculopathy, lumbar region: Secondary | ICD-10-CM | POA: Diagnosis not present

## 2020-04-21 DIAGNOSIS — M5136 Other intervertebral disc degeneration, lumbar region: Secondary | ICD-10-CM | POA: Diagnosis not present

## 2020-06-09 DIAGNOSIS — J4540 Moderate persistent asthma, uncomplicated: Secondary | ICD-10-CM | POA: Diagnosis not present

## 2020-06-09 DIAGNOSIS — R053 Chronic cough: Secondary | ICD-10-CM | POA: Diagnosis not present

## 2020-06-09 DIAGNOSIS — Z01818 Encounter for other preprocedural examination: Secondary | ICD-10-CM | POA: Diagnosis not present

## 2020-06-12 DIAGNOSIS — E039 Hypothyroidism, unspecified: Secondary | ICD-10-CM | POA: Diagnosis not present

## 2020-06-12 DIAGNOSIS — Z0000 Encounter for general adult medical examination without abnormal findings: Secondary | ICD-10-CM | POA: Diagnosis not present

## 2020-06-12 DIAGNOSIS — Z23 Encounter for immunization: Secondary | ICD-10-CM | POA: Diagnosis not present

## 2020-06-12 DIAGNOSIS — M810 Age-related osteoporosis without current pathological fracture: Secondary | ICD-10-CM | POA: Diagnosis not present

## 2020-06-12 DIAGNOSIS — N1832 Chronic kidney disease, stage 3b: Secondary | ICD-10-CM | POA: Diagnosis not present

## 2020-06-12 DIAGNOSIS — I129 Hypertensive chronic kidney disease with stage 1 through stage 4 chronic kidney disease, or unspecified chronic kidney disease: Secondary | ICD-10-CM | POA: Diagnosis not present

## 2020-06-12 DIAGNOSIS — Z79891 Long term (current) use of opiate analgesic: Secondary | ICD-10-CM | POA: Diagnosis not present

## 2020-06-12 DIAGNOSIS — F411 Generalized anxiety disorder: Secondary | ICD-10-CM | POA: Diagnosis not present

## 2020-06-12 DIAGNOSIS — F325 Major depressive disorder, single episode, in full remission: Secondary | ICD-10-CM | POA: Diagnosis not present

## 2020-07-14 DIAGNOSIS — J309 Allergic rhinitis, unspecified: Secondary | ICD-10-CM | POA: Diagnosis not present

## 2020-07-14 DIAGNOSIS — J300 Vasomotor rhinitis: Secondary | ICD-10-CM | POA: Diagnosis not present

## 2020-07-14 DIAGNOSIS — K219 Gastro-esophageal reflux disease without esophagitis: Secondary | ICD-10-CM | POA: Diagnosis not present

## 2020-07-14 DIAGNOSIS — R059 Cough, unspecified: Secondary | ICD-10-CM | POA: Diagnosis not present

## 2020-09-07 DIAGNOSIS — J4540 Moderate persistent asthma, uncomplicated: Secondary | ICD-10-CM | POA: Diagnosis not present

## 2020-09-09 DIAGNOSIS — R053 Chronic cough: Secondary | ICD-10-CM | POA: Diagnosis not present

## 2020-10-21 DIAGNOSIS — R911 Solitary pulmonary nodule: Secondary | ICD-10-CM | POA: Diagnosis not present

## 2020-10-21 DIAGNOSIS — R053 Chronic cough: Secondary | ICD-10-CM | POA: Diagnosis not present

## 2020-10-22 ENCOUNTER — Other Ambulatory Visit: Payer: Self-pay | Admitting: Pulmonary Disease

## 2020-10-22 DIAGNOSIS — R911 Solitary pulmonary nodule: Secondary | ICD-10-CM

## 2020-10-28 ENCOUNTER — Other Ambulatory Visit: Payer: Self-pay | Admitting: Dermatology

## 2020-11-05 ENCOUNTER — Ambulatory Visit
Admission: RE | Admit: 2020-11-05 | Discharge: 2020-11-05 | Disposition: A | Discharge status: Home / Self Care | Payer: Medicare HMO | Source: Ambulatory Visit | Attending: Pulmonary Disease | Admitting: Pulmonary Disease

## 2020-11-05 ENCOUNTER — Other Ambulatory Visit: Payer: Self-pay

## 2020-11-05 DIAGNOSIS — I700 Atherosclerosis of aorta: Secondary | ICD-10-CM | POA: Diagnosis not present

## 2020-11-05 DIAGNOSIS — R911 Solitary pulmonary nodule: Secondary | ICD-10-CM | POA: Diagnosis not present

## 2020-11-05 DIAGNOSIS — I2510 Atherosclerotic heart disease of native coronary artery without angina pectoris: Secondary | ICD-10-CM | POA: Diagnosis not present

## 2020-11-05 DIAGNOSIS — I77810 Thoracic aortic ectasia: Secondary | ICD-10-CM | POA: Diagnosis not present

## 2020-11-05 DIAGNOSIS — J984 Other disorders of lung: Secondary | ICD-10-CM | POA: Diagnosis not present

## 2020-11-17 DIAGNOSIS — R053 Chronic cough: Secondary | ICD-10-CM | POA: Diagnosis not present

## 2020-11-27 DIAGNOSIS — R053 Chronic cough: Secondary | ICD-10-CM | POA: Diagnosis not present

## 2020-11-27 DIAGNOSIS — R918 Other nonspecific abnormal finding of lung field: Secondary | ICD-10-CM | POA: Diagnosis not present

## 2020-12-03 DIAGNOSIS — Z79891 Long term (current) use of opiate analgesic: Secondary | ICD-10-CM | POA: Diagnosis not present

## 2020-12-03 DIAGNOSIS — N1832 Chronic kidney disease, stage 3b: Secondary | ICD-10-CM | POA: Diagnosis not present

## 2020-12-03 DIAGNOSIS — K219 Gastro-esophageal reflux disease without esophagitis: Secondary | ICD-10-CM | POA: Diagnosis not present

## 2020-12-03 DIAGNOSIS — E785 Hyperlipidemia, unspecified: Secondary | ICD-10-CM | POA: Diagnosis not present

## 2020-12-03 DIAGNOSIS — I10 Essential (primary) hypertension: Secondary | ICD-10-CM | POA: Diagnosis not present

## 2020-12-03 DIAGNOSIS — E039 Hypothyroidism, unspecified: Secondary | ICD-10-CM | POA: Diagnosis not present

## 2020-12-14 DIAGNOSIS — J45909 Unspecified asthma, uncomplicated: Secondary | ICD-10-CM | POA: Diagnosis not present

## 2020-12-14 DIAGNOSIS — M810 Age-related osteoporosis without current pathological fracture: Secondary | ICD-10-CM | POA: Diagnosis not present

## 2020-12-14 DIAGNOSIS — M5416 Radiculopathy, lumbar region: Secondary | ICD-10-CM | POA: Diagnosis not present

## 2020-12-14 DIAGNOSIS — E039 Hypothyroidism, unspecified: Secondary | ICD-10-CM | POA: Diagnosis not present

## 2020-12-14 DIAGNOSIS — M48061 Spinal stenosis, lumbar region without neurogenic claudication: Secondary | ICD-10-CM | POA: Diagnosis not present

## 2020-12-14 DIAGNOSIS — F325 Major depressive disorder, single episode, in full remission: Secondary | ICD-10-CM | POA: Diagnosis not present

## 2020-12-14 DIAGNOSIS — N1832 Chronic kidney disease, stage 3b: Secondary | ICD-10-CM | POA: Diagnosis not present

## 2020-12-14 DIAGNOSIS — I129 Hypertensive chronic kidney disease with stage 1 through stage 4 chronic kidney disease, or unspecified chronic kidney disease: Secondary | ICD-10-CM | POA: Diagnosis not present

## 2020-12-14 DIAGNOSIS — G894 Chronic pain syndrome: Secondary | ICD-10-CM | POA: Diagnosis not present

## 2021-01-15 DIAGNOSIS — N1832 Chronic kidney disease, stage 3b: Secondary | ICD-10-CM | POA: Diagnosis not present

## 2021-01-15 DIAGNOSIS — M25561 Pain in right knee: Secondary | ICD-10-CM | POA: Diagnosis not present

## 2021-01-15 DIAGNOSIS — F325 Major depressive disorder, single episode, in full remission: Secondary | ICD-10-CM | POA: Diagnosis not present

## 2021-01-15 DIAGNOSIS — I129 Hypertensive chronic kidney disease with stage 1 through stage 4 chronic kidney disease, or unspecified chronic kidney disease: Secondary | ICD-10-CM | POA: Diagnosis not present

## 2021-02-04 ENCOUNTER — Other Ambulatory Visit: Payer: Self-pay | Admitting: Gastroenterology

## 2021-02-04 DIAGNOSIS — R1314 Dysphagia, pharyngoesophageal phase: Secondary | ICD-10-CM

## 2021-02-04 DIAGNOSIS — R053 Chronic cough: Secondary | ICD-10-CM | POA: Diagnosis not present

## 2021-02-04 DIAGNOSIS — K219 Gastro-esophageal reflux disease without esophagitis: Secondary | ICD-10-CM

## 2021-02-04 DIAGNOSIS — G894 Chronic pain syndrome: Secondary | ICD-10-CM | POA: Diagnosis not present

## 2021-02-16 ENCOUNTER — Ambulatory Visit
Admission: RE | Admit: 2021-02-16 | Discharge: 2021-02-16 | Disposition: A | Discharge status: Home / Self Care | Payer: Medicare HMO | Source: Ambulatory Visit | Attending: Gastroenterology | Admitting: Gastroenterology

## 2021-02-16 ENCOUNTER — Other Ambulatory Visit: Payer: Self-pay

## 2021-02-16 DIAGNOSIS — R1314 Dysphagia, pharyngoesophageal phase: Secondary | ICD-10-CM | POA: Insufficient documentation

## 2021-02-16 DIAGNOSIS — R053 Chronic cough: Secondary | ICD-10-CM | POA: Diagnosis not present

## 2021-02-16 DIAGNOSIS — K219 Gastro-esophageal reflux disease without esophagitis: Secondary | ICD-10-CM | POA: Diagnosis not present

## 2021-02-16 DIAGNOSIS — K224 Dyskinesia of esophagus: Secondary | ICD-10-CM | POA: Diagnosis not present

## 2021-02-24 DIAGNOSIS — Z9889 Other specified postprocedural states: Secondary | ICD-10-CM | POA: Diagnosis not present

## 2021-02-24 DIAGNOSIS — Z96651 Presence of right artificial knee joint: Secondary | ICD-10-CM | POA: Diagnosis not present

## 2021-02-24 DIAGNOSIS — M1710 Unilateral primary osteoarthritis, unspecified knee: Secondary | ICD-10-CM | POA: Diagnosis not present

## 2021-11-08 ENCOUNTER — Encounter (INDEPENDENT_AMBULATORY_CARE_PROVIDER_SITE_OTHER): Payer: Self-pay | Admitting: Vascular Surgery

## 2021-11-08 ENCOUNTER — Ambulatory Visit (INDEPENDENT_AMBULATORY_CARE_PROVIDER_SITE_OTHER): Payer: Medicare HMO | Admitting: Vascular Surgery

## 2021-11-08 ENCOUNTER — Other Ambulatory Visit: Payer: Self-pay

## 2021-11-08 VITALS — BP 149/86 | HR 84 | Resp 16 | Ht 63.0 in | Wt 160.8 lb

## 2021-11-08 DIAGNOSIS — I890 Lymphedema, not elsewhere classified: Secondary | ICD-10-CM | POA: Insufficient documentation

## 2021-11-08 DIAGNOSIS — I10 Essential (primary) hypertension: Secondary | ICD-10-CM | POA: Diagnosis not present

## 2021-11-08 DIAGNOSIS — J45909 Unspecified asthma, uncomplicated: Secondary | ICD-10-CM

## 2021-11-08 DIAGNOSIS — M48061 Spinal stenosis, lumbar region without neurogenic claudication: Secondary | ICD-10-CM

## 2021-11-08 DIAGNOSIS — I872 Venous insufficiency (chronic) (peripheral): Secondary | ICD-10-CM

## 2021-11-08 DIAGNOSIS — M5416 Radiculopathy, lumbar region: Secondary | ICD-10-CM

## 2021-11-08 NOTE — Progress Notes (Signed)
MRN : [id number]  April Oliver is a 88 y.o. (1934-01-27) female who presents with chief complaint of leg swelling.  History of Present Illness:   Patient is seen for evaluation of leg pain and leg swelling. The patient first noticed the swelling remotely. The swelling is associated with pain and discoloration. The pain and swelling worsens with prolonged dependency and improves with elevation. The pain is unrelated to activity.  The patient notes that in the morning the legs are significantly improved but they steadily worsened throughout the course of the day. The patient also notes a steady worsening of the discoloration in the ankle and shin area.   The patient denies claudication symptoms.  The patient denies symptoms consistent with rest pain.  The patient has a history of DJD and LS spine disease.    The patient has no had any past angiography, interventions or vascular surgery.  Although she did have venous ablations bilaterally in the remote past.  Elevation makes the leg symptoms better, dependency makes them much worse. There is no history of ulcerations. The patient denies any recent changes in medications.  The patient has not been wearing graduated compression.  The patient denies a history of DVT or PE. There is no prior history of phlebitis. There is no history of primary lymphedema.  No history of malignancies. No history of trauma or groin or pelvic surgery. There is no history of radiation treatment to the groin or pelvis  The patient denies amaurosis fugax or recent TIA symptoms. There are no recent neurological changes noted. The patient denies recent episodes of angina or shortness of breath   Current Meds  Medication Sig   albuterol (VENTOLIN HFA) 108 (90 Base) MCG/ACT inhaler INHALE 2 INHALATIONS BY MOUTH INTO THE LUNGS EVERY 6 HOURS AS NEEDED FOR WHEEZING   benzonatate (TESSALON) 200 MG capsule TAKE 1 CAPSULE BY MOUTH 3 TIMES A DAY AS NEEDED FOR COUGH FOR UP  TO 7DAYS.   Cholecalciferol (VITAMIN D3) 25 MCG (1000 UT) CAPS Take by mouth.   gabapentin (NEURONTIN) 300 MG capsule 800 mg.   levothyroxine (SYNTHROID, LEVOTHROID) 75 MCG tablet    Melatonin 3 MG TABS Take by mouth.   metoprolol tartrate (LOPRESSOR) 25 MG tablet    Multiple Vitamins-Minerals (CENTRUM SILVER PO) Take by mouth.   pantoprazole (PROTONIX) 40 MG tablet TAKE 1 TABLET BY MOUTH TWICE A DAY BEFORE MEALS   potassium chloride (K-DUR) 10 MEQ tablet TAKE 1 TABLET BY MOUTH ONCE DAILY WITH LASIX   QUEtiapine (SEROQUEL) 25 MG tablet TAKE ONE AND ONE-HALF TABLET BY MOUTH ATBEDTIME FOR SLEEP AND ANXIETY   traMADol (ULTRAM) 50 MG tablet TAKE 1 TO 2 TABLETS BY MOUTH EVERY 8 HOURS AS NEEDED FOR PAIN   traZODone (DESYREL) 100 MG tablet TAKE 2 TABLETS BY MOUTH AT BEDTIME AS NEEDED FOR SLEEP   Zinc 50 MG TABS Take by mouth.   Current Facility-Administered Medications for the 11/08/21 encounter (Office Visit) with Delana Meyer, Dolores Lory, MD  Medication   triamcinolone acetonide (KENALOG) 10 MG/ML injection 10 mg    Past Medical History:  Diagnosis Date   Basal cell carcinoma    Chronic back pain    Chronic sciatica 03/11/2014   Gonalgia 06/10/2014   Hypertension    Hypothyroidism    Low back pain with sciatica 05/14/2014   MVP (mitral valve prolapse)    Scoliosis     Past Surgical History:  Procedure Laterality Date   APPENDECTOMY     BACK  SURGERY     x 5   CATARACT EXTRACTION Bilateral    JOINT REPLACEMENT Bilateral    knee   NECK SURGERY     x 2   ROTATOR CUFF REPAIR Left     Social History Social History   Tobacco Use   Smoking status: Former    Packs/day: 0.25    Types: Cigarettes    Quit date: 08/2018    Years since quitting: 3.1   Smokeless tobacco: Never  Substance Use Topics   Alcohol use: Yes    Alcohol/week: 7.0 standard drinks    Types: 7 Glasses of wine per week    Comment: glass of wine daily    Family History Family History  Problem Relation Age of  Onset   Mental illness Neg Hx     Allergies  Allergen Reactions   Ambien [Zolpidem] Other (See Comments)    Other reaction(s): Altered mental status (finding) hallucinations   Morphine And Related Other (See Comments)    hyper     REVIEW OF SYSTEMS (Negative unless checked)  Constitutional: '[]'$ Weight loss  '[]'$ Fever  '[]'$ Chills Cardiac: '[]'$ Chest pain   '[]'$ Chest pressure   '[]'$ Palpitations   '[]'$ Shortness of breath when laying flat   '[]'$ Shortness of breath with exertion. Vascular:  '[]'$ Pain in legs with walking   '[]'$ Pain in legs at rest  '[]'$ History of DVT   '[]'$ Phlebitis   '[x]'$ Swelling in legs   '[]'$ Varicose veins   '[]'$ Non-healing ulcers Pulmonary:   '[]'$ Uses home oxygen   '[]'$ Productive cough   '[]'$ Hemoptysis   '[]'$ Wheeze  '[]'$ COPD   '[]'$ Asthma Neurologic:  '[]'$ Dizziness   '[]'$ Seizures   '[]'$ History of stroke   '[]'$ History of TIA  '[]'$ Aphasia   '[]'$ Vissual changes   '[]'$ Weakness or numbness in arm   '[]'$ Weakness or numbness in leg Musculoskeletal:   '[]'$ Joint swelling   '[]'$ Joint pain   '[x]'$ Low back pain Hematologic:  '[]'$ Easy bruising  '[]'$ Easy bleeding   '[]'$ Hypercoagulable state   '[]'$ Anemic Gastrointestinal:  '[]'$ Diarrhea   '[]'$ Vomiting  '[x]'$ Gastroesophageal reflux/heartburn   '[]'$ Difficulty swallowing. Genitourinary:  '[]'$ Chronic kidney disease   '[]'$ Difficult urination  '[]'$ Frequent urination   '[]'$ Blood in urine Skin:  '[x]'$ Rashes   '[]'$ Ulcers  Psychological:  '[]'$ History of anxiety   '[]'$  History of major depression.  Physical Examination  Vitals:   11/08/21 1108  BP: (!) 149/86  Pulse: 84  Resp: 16  Weight: 160 lb 12.8 oz (72.9 kg)  Height: '5\' 3"'$  (1.6 m)   Body mass index is 28.48 kg/m. Gen: WD/WN, NAD Head: [place]/AT, No temporalis wasting.  Ear/Nose/Throat: Hearing grossly intact, nares w/o erythema or drainage, pinna without lesions Eyes: PER, EOMI, sclera nonicteric.  Neck: Supple, no gross masses.  No JVD.  Pulmonary:  Good air movement, no audible wheezing, no use of accessory muscles.  Cardiac: RRR, precordium not hyperdynamic. Vascular:   scattered varicosities present bilaterally.  Severe venous stasis changes to the legs bilaterally.  3-4+ soft pitting edema  Vessel Right Left  Radial Palpable Palpable  Gastrointestinal: soft, non-distended. No guarding/no peritoneal signs.  Musculoskeletal: M/S 5/5 throughout.  No deformity.  Neurologic: CN 2-12 intact. Pain and light touch intact in extremities.  Symmetrical.  Speech is fluent. Motor exam as listed above. Psychiatric: Judgment intact, Mood & affect appropriate for pt's clinical situation. Dermatologic: Severe venous rashes no ulcers noted.  No changes consistent with cellulitis. Lymph : No lichenification or skin changes of chronic lymphedema.  CBC Lab Results  Component Value Date   WBC 5.7 08/01/2014   HGB 10.4 (  L) 08/01/2014   HCT 30.6 (L) 08/01/2014   MCV 97 08/01/2014   PLT 269 08/01/2014    BMET    Component Value Date/Time   NA 135 (L) 08/01/2014 1543   K 3.8 08/01/2014 1543   CL 100 08/01/2014 1543   CO2 29 08/01/2014 1543   GLUCOSE 78 08/01/2014 1543   BUN 8 08/01/2014 1543   CREATININE 1.00 11/07/2017 1144   CREATININE 0.89 08/01/2014 1543   CALCIUM 8.3 (L) 08/01/2014 1543   GFRNONAA >60 08/01/2014 1543   GFRAA >60 08/01/2014 1543   CrCl cannot be calculated (Patient's most recent lab result is older than the maximum 21 days allowed.).  COAG Lab Results  Component Value Date   INR 1.0 05/26/2014    Radiology No results found.   Assessment/Plan 1. Lymphedema I have had a long discussion with the patient regarding swelling and why it  causes symptoms.  Patient will begin wearing graduated compression stockings class 1 (20-30 mmHg) on a daily basis a prescription was given. The patient will  beginning wearing the stockings first thing in the morning and removing them in the evening. The patient is instructed specifically not to sleep in the stockings.   In addition, behavioral modification will be initiated.  This will include frequent  elevation, use of over the counter pain medications and exercise such as walking.  I have reviewed systemic causes for chronic edema such as liver, kidney and cardiac etiologies.  The patient denies problems with these organ systems.    Consideration for a lymph pump will also be made based upon the effectiveness of conservative therapy.  This would help to improve the edema control and prevent sequela such as ulcers and infections   Patient should undergo duplex ultrasound of the venous system to ensure that DVT or reflux is not present.  The patient will follow-up with me after the ultrasound.   - VAS [place] LOWER EXTREMITY VENOUS REFLUX; Future  2. Chronic venous insufficiency I have had a long discussion with the patient regarding swelling and why it  causes symptoms.  Patient will begin wearing graduated compression stockings class 1 (20-30 mmHg) on a daily basis a prescription was given. The patient will  beginning wearing the stockings first thing in the morning and removing them in the evening. The patient is instructed specifically not to sleep in the stockings.   In addition, behavioral modification will be initiated.  This will include frequent elevation, use of over the counter pain medications and exercise such as walking.  I have reviewed systemic causes for chronic edema such as liver, kidney and cardiac etiologies.  The patient denies problems with these organ systems.    Consideration for a lymph pump will also be made based upon the effectiveness of conservative therapy.  This would help to improve the edema control and prevent sequela such as ulcers and infections   Patient should undergo duplex ultrasound of the venous system to ensure that DVT or reflux is not present.  The patient will follow-up with me after the ultrasound.   - VAS [place] LOWER EXTREMITY VENOUS REFLUX; Future  3. Essential hypertension Continue antihypertensive medications as already ordered, these  medications have been reviewed and there are no changes at this time.   4. Spinal stenosis of lumbar region with radiculopathy Continue NSAID medications as already ordered, these medications have been reviewed and there are no changes at this time.  Continued activity and therapy was stressed.   5. Reactive  airway disease without complication, unspecified asthma severity, unspecified whether persistent Continue pulmonary medications and aerosols as already ordered, these medications have been reviewed and there are no changes at this time.      Hortencia Pilar, MD  11/08/2021 11:20 AM

## 2021-11-17 ENCOUNTER — Encounter (INDEPENDENT_AMBULATORY_CARE_PROVIDER_SITE_OTHER): Payer: Medicare HMO | Admitting: Nurse Practitioner

## 2021-11-26 ENCOUNTER — Telehealth (INDEPENDENT_AMBULATORY_CARE_PROVIDER_SITE_OTHER): Payer: Self-pay

## 2021-11-26 NOTE — Telephone Encounter (Signed)
April Oliver from [facility] called stating the patient has a right leg large blister on the shin right about the ankle. Last seen on 11/08/21 by Dr. Delana Meyer. Please advise. ?

## 2021-11-29 NOTE — Telephone Encounter (Signed)
I called the patient and gave her the recommendation from Eulogio Ditch NP. Patient stated she spoke with two pharmacists and was told to use triple antibiotic ointment. Patient was informed of what an Unna boot was and how it worked and she still refused. ?

## 2021-11-29 NOTE — Telephone Encounter (Signed)
They can put patient in unna wraps ?

## 2021-11-30 ENCOUNTER — Telehealth (INDEPENDENT_AMBULATORY_CARE_PROVIDER_SITE_OTHER): Payer: Self-pay

## 2021-11-30 NOTE — Telephone Encounter (Signed)
Patient was made aware with medical recommendations and stated that she wanted for [place] to contact Adam from [facility] to receive orders for right leg unna wrap. I have contacted Adam and left a message on his voicemail to return a call back to the office. ?

## 2021-11-30 NOTE — Telephone Encounter (Signed)
If she doesn't want to wrap the are she needs to give her leg some sort of compression.  Therefore if she doesn't want to do unna wraps she can place a band aid and wear compression socks

## 2021-12-01 NOTE — Telephone Encounter (Signed)
Add on to note April Oliver from [facility] home care [phone] ?

## 2021-12-01 NOTE — Telephone Encounter (Signed)
Patient contacted the office and informed that her blister has a yellow color drainage and she contacted her pharmacist. The patient is using neosporin with triple antibiotic ointment and band aide to cover the area. Patient will try wear compression socks and elevated. Patient will contact the office if she has any other concerns. I will make our provider aware with information ?

## 2021-12-07 ENCOUNTER — Encounter (INDEPENDENT_AMBULATORY_CARE_PROVIDER_SITE_OTHER): Payer: Medicare HMO

## 2022-01-12 ENCOUNTER — Telehealth (INDEPENDENT_AMBULATORY_CARE_PROVIDER_SITE_OTHER): Payer: Self-pay | Admitting: Vascular Surgery

## 2022-01-12 NOTE — Telephone Encounter (Signed)
Patient was informed that on the prescription there are some recommended places to contact. ?

## 2022-01-12 NOTE — Telephone Encounter (Signed)
Pt. LVM stating she was given a prescription to get compression socks and want to know where can she go to get them.  Please advise. ?

## 2022-02-10 ENCOUNTER — Other Ambulatory Visit (INDEPENDENT_AMBULATORY_CARE_PROVIDER_SITE_OTHER): Payer: Self-pay | Admitting: Vascular Surgery

## 2022-02-10 DIAGNOSIS — I890 Lymphedema, not elsewhere classified: Secondary | ICD-10-CM

## 2022-02-10 DIAGNOSIS — I872 Venous insufficiency (chronic) (peripheral): Secondary | ICD-10-CM

## 2022-02-16 ENCOUNTER — Ambulatory Visit
Admission: RE | Admit: 2022-02-16 | Discharge: 2022-02-16 | Disposition: A | Discharge status: Home / Self Care | Payer: Medicare HMO | Source: Ambulatory Visit | Attending: Student | Admitting: Student

## 2022-02-16 ENCOUNTER — Other Ambulatory Visit: Payer: Self-pay | Admitting: Student

## 2022-02-16 DIAGNOSIS — R059 Cough, unspecified: Secondary | ICD-10-CM

## 2022-02-21 ENCOUNTER — Encounter (INDEPENDENT_AMBULATORY_CARE_PROVIDER_SITE_OTHER): Payer: Medicare HMO

## 2022-02-21 ENCOUNTER — Ambulatory Visit (INDEPENDENT_AMBULATORY_CARE_PROVIDER_SITE_OTHER): Payer: Medicare HMO | Admitting: Vascular Surgery

## 2022-02-21 NOTE — Progress Notes (Deleted)
MRN : [id number]  April Oliver is a 88 y.o. ([birth year]) female who presents with chief complaint of legs swell.  History of Present Illness: ***  No outpatient medications have been marked as taking for the 02/21/22 encounter (Appointment) with Delana Meyer, Dolores Lory, MD.   Current Facility-Administered Medications for the 02/21/22 encounter (Appointment) with Delana Meyer, Dolores Lory, MD  Medication   triamcinolone acetonide (KENALOG) 10 MG/ML injection 10 mg    Past Medical History:  Diagnosis Date   Basal cell carcinoma    Chronic back pain    Chronic sciatica 03/11/2014   Gonalgia 06/10/2014   Hypertension    Hypothyroidism    Low back pain with sciatica 05/14/2014   MVP (mitral valve prolapse)    Scoliosis     Past Surgical History:  Procedure Laterality Date   APPENDECTOMY     BACK SURGERY     x 5   CATARACT EXTRACTION Bilateral    JOINT REPLACEMENT Bilateral    knee   NECK SURGERY     x 2   ROTATOR CUFF REPAIR Left     Social History Social History   Tobacco Use   Smoking status: Former    Packs/day: 0.25    Types: Cigarettes    Quit date: 08/2018    Years since quitting: 3.4   Smokeless tobacco: Never  Substance Use Topics   Alcohol use: Yes    Alcohol/week: 7.0 standard drinks of alcohol    Types: 7 Glasses of wine per week    Comment: glass of wine daily    Family History Family History  Problem Relation Age of Onset   Mental illness Neg Hx     Allergies  Allergen Reactions   Ambien [Zolpidem] Other (See Comments)    Other reaction(s): Altered mental status (finding) hallucinations   Morphine And Related Other (See Comments)    hyper     REVIEW OF SYSTEMS (Negative unless checked)  Constitutional: '[]'$ Weight loss  '[]'$ Fever  '[]'$ Chills Cardiac: '[]'$ Chest pain   '[]'$ Chest pressure   '[]'$ Palpitations   '[]'$ Shortness of breath when laying flat   '[]'$ Shortness of breath with exertion. Vascular:  '[]'$ Pain in legs with walking   '[x]'$ Pain in legs with standing   '[]'$ History of DVT   '[]'$ Phlebitis   '[x]'$ Swelling in legs   '[]'$ Varicose veins   '[]'$ Non-healing ulcers Pulmonary:   '[]'$ Uses home oxygen   '[]'$ Productive cough   '[]'$ Hemoptysis   '[]'$ Wheeze  '[]'$ COPD   '[]'$ Asthma Neurologic:  '[]'$ Dizziness   '[]'$ Seizures   '[]'$ History of stroke   '[]'$ History of TIA  '[]'$ Aphasia   '[]'$ Vissual changes   '[]'$ Weakness or numbness in arm   '[]'$ Weakness or numbness in leg Musculoskeletal:   '[]'$ Joint swelling   '[]'$ Joint pain   '[]'$ Low back pain Hematologic:  '[]'$ Easy bruising  '[]'$ Easy bleeding   '[]'$ Hypercoagulable state   '[]'$ Anemic Gastrointestinal:  '[]'$ Diarrhea   '[]'$ Vomiting  '[]'$ Gastroesophageal reflux/heartburn   '[]'$ Difficulty swallowing. Genitourinary:  '[]'$ Chronic kidney disease   '[]'$ Difficult urination  '[]'$ Frequent urination   '[]'$ Blood in urine Skin:  '[]'$ Rashes   '[]'$ Ulcers  Psychological:  '[]'$ History of anxiety   '[]'$  History of major depression.  Physical Examination  There were no vitals filed for this visit. There is no height or weight on file to calculate BMI. Gen: WD/WN, NAD Head: [place]/AT, No temporalis wasting.  Ear/Nose/Throat: Hearing grossly intact, nares w/o erythema or drainage, pinna without lesions Eyes: PER, EOMI, sclera nonicteric.  Neck: Supple, no gross masses.  No JVD.  Pulmonary:  Good  air movement, no audible wheezing, no use of accessory muscles.  Cardiac: RRR, precordium not hyperdynamic. Vascular:  scattered varicosities present bilaterally.  Mild venous stasis changes to the legs bilaterally.  3-4+ soft pitting edema  Vessel Right Left  Radial Palpable Palpable  Gastrointestinal: soft, non-distended. No guarding/no peritoneal signs.  Musculoskeletal: M/S 5/5 throughout.  No deformity.  Neurologic: CN 2-12 intact. Pain and light touch intact in extremities.  Symmetrical.  Speech is fluent. Motor exam as listed above. Psychiatric: Judgment intact, Mood & affect appropriate for pt's clinical situation. Dermatologic: Venous rashes no ulcers noted.  No changes consistent with cellulitis. Lymph :  No lichenification or skin changes of chronic lymphedema.  CBC Lab Results  Component Value Date   WBC 5.7 08/01/2014   HGB 10.4 (L) 08/01/2014   HCT 30.6 (L) 08/01/2014   MCV 97 08/01/2014   PLT 269 08/01/2014    BMET    Component Value Date/Time   NA 135 (L) 08/01/2014 1543   K 3.8 08/01/2014 1543   CL 100 08/01/2014 1543   CO2 29 08/01/2014 1543   GLUCOSE 78 08/01/2014 1543   BUN 8 08/01/2014 1543   CREATININE 1.00 11/07/2017 1144   CREATININE 0.89 08/01/2014 1543   CALCIUM 8.3 (L) 08/01/2014 1543   GFRNONAA >60 08/01/2014 1543   GFRAA >60 08/01/2014 1543   CrCl cannot be calculated (Patient's most recent lab result is older than the maximum 21 days allowed.).  COAG Lab Results  Component Value Date   INR 1.0 05/26/2014    Radiology DG Chest 2 View  Result Date: 02/16/2022 CLINICAL DATA:  Cough. EXAM: CHEST - 2 VIEW COMPARISON:  Chest CT dated 11/05/2020. FINDINGS: No focal consolidation, pleural effusion, or pneumothorax. Top-normal cardiac silhouette. Osteopenia with degenerative changes of the spine. No acute osseous pathology no IMPRESSION: No active cardiopulmonary disease. Electronically Signed   By: Anner Crete M.D.   On: 02/16/2022 20:26     Assessment/Plan There are no diagnoses linked to this encounter.   Hortencia Pilar, MD  02/21/2022 8:46 AM

## 2022-05-09 ENCOUNTER — Other Ambulatory Visit: Payer: Self-pay | Admitting: Gastroenterology

## 2022-05-09 DIAGNOSIS — R1314 Dysphagia, pharyngoesophageal phase: Secondary | ICD-10-CM

## 2022-05-09 DIAGNOSIS — K224 Dyskinesia of esophagus: Secondary | ICD-10-CM

## 2022-05-09 DIAGNOSIS — R0989 Other specified symptoms and signs involving the circulatory and respiratory systems: Secondary | ICD-10-CM

## 2022-05-19 ENCOUNTER — Ambulatory Visit
Admission: RE | Admit: 2022-05-19 | Discharge: 2022-05-19 | Disposition: A | Discharge status: Home / Self Care | Payer: Medicare HMO | Source: Ambulatory Visit | Attending: Gastroenterology | Admitting: Gastroenterology

## 2022-05-19 DIAGNOSIS — R1314 Dysphagia, pharyngoesophageal phase: Secondary | ICD-10-CM | POA: Insufficient documentation

## 2022-05-19 DIAGNOSIS — R0989 Other specified symptoms and signs involving the circulatory and respiratory systems: Secondary | ICD-10-CM | POA: Diagnosis present

## 2022-05-19 DIAGNOSIS — K224 Dyskinesia of esophagus: Secondary | ICD-10-CM | POA: Diagnosis present

## 2023-02-28 ENCOUNTER — Other Ambulatory Visit: Payer: Self-pay | Admitting: Orthopedic Surgery

## 2023-02-28 DIAGNOSIS — G8929 Other chronic pain: Secondary | ICD-10-CM

## 2023-03-07 ENCOUNTER — Encounter
Admission: RE | Admit: 2023-03-07 | Discharge: 2023-03-07 | Disposition: A | Discharge status: Home / Self Care | Payer: Medicare HMO | Source: Ambulatory Visit | Attending: Orthopedic Surgery | Admitting: Orthopedic Surgery

## 2023-03-07 DIAGNOSIS — M545 Low back pain, unspecified: Secondary | ICD-10-CM | POA: Insufficient documentation

## 2023-03-07 DIAGNOSIS — G8929 Other chronic pain: Secondary | ICD-10-CM | POA: Insufficient documentation

## 2023-03-07 MED ORDER — TECHNETIUM TC 99M MEDRONATE IV KIT
20.0000 | PACK | Freq: Once | INTRAVENOUS | Status: AC | PRN
Start: 2023-03-07 — End: 2023-03-07
  Administered 2023-03-07: 22.56 via INTRAVENOUS

## 2023-04-14 ENCOUNTER — Emergency Department: Payer: Medicare HMO

## 2023-04-14 ENCOUNTER — Encounter: Payer: Self-pay | Admitting: *Deleted

## 2023-04-14 ENCOUNTER — Other Ambulatory Visit: Payer: Self-pay

## 2023-04-14 DIAGNOSIS — S0083XA Contusion of other part of head, initial encounter: Secondary | ICD-10-CM | POA: Diagnosis not present

## 2023-04-14 DIAGNOSIS — W19XXXA Unspecified fall, initial encounter: Secondary | ICD-10-CM | POA: Insufficient documentation

## 2023-04-14 DIAGNOSIS — S0990XA Unspecified injury of head, initial encounter: Secondary | ICD-10-CM | POA: Diagnosis present

## 2023-04-14 NOTE — ED Triage Notes (Signed)
First Nurse Note: Pt arrives via [facility] from home with complaints of ground level fall. Pt lost her balance and hit her head on the cushion of the couch. No LOC.   VSS with EMS

## 2023-04-14 NOTE — ED Triage Notes (Addendum)
Pt reports she lives at [facility] independent living. States she was using her walker and may have tripped on the rug when walking to the kitchen today. She c/o lumbar pain (prior hx of back injury/surgeries). She states she did hit her head (abrasion to the left forehead and left cheek), no loc, no blood thinners. MAE x 4. A/O.

## 2023-04-15 ENCOUNTER — Emergency Department
Admission: EM | Admit: 2023-04-15 | Discharge: 2023-04-15 | Disposition: A | Discharge status: Home / Self Care | Payer: Medicare HMO | Attending: Emergency Medicine | Admitting: Emergency Medicine

## 2023-04-15 DIAGNOSIS — S0990XA Unspecified injury of head, initial encounter: Secondary | ICD-10-CM

## 2023-04-15 DIAGNOSIS — W19XXXA Unspecified fall, initial encounter: Secondary | ICD-10-CM

## 2023-04-15 DIAGNOSIS — S0083XA Contusion of other part of head, initial encounter: Secondary | ICD-10-CM

## 2023-04-15 NOTE — ED Provider Notes (Signed)
[facility] Provider Note    Event Date/Time   First MD Initiated Contact with Patient 04/15/23 0018     (approximate)   History   Fall   HPI April Oliver is a 89 y.o. female who has a history of extensive back surgeries and who sees Dr. Rosita Kea with orthopedics.  She presents tonight after mechanical fall.  She was at home and states she is always very careful ambulating with her walker, but unfortunately she tripped over a rug and landed on the left side of her face and forehead.  She has pain in her head and her neck at the site where she fell.  She has no numbness nor weakness in her extremities and is otherwise at baseline.  It happened too quick for her to try and catch herself with her arms so she has no injuries to her wrist or her hands or arms.  She said that this was just a silly accident and that she did not pass out.  No chest pain or shortness of breath.  No recent illness.     Physical Exam   Triage Vital Signs: ED Triage Vitals  Encounter Vitals Group     BP 04/14/23 2058 (!) 163/92     Systolic BP Percentile --      Diastolic BP Percentile --      Pulse Rate 04/14/23 2058 75     Resp 04/14/23 2058 18     Temp 04/14/23 2058 98 F (36.7 C)     Temp Source 04/14/23 2058 Oral     SpO2 04/14/23 2058 98 %     Weight --      Height --      Head Circumference --      Peak Flow --      Pain Score 04/14/23 2059 8     Pain Loc --      Pain Education --      Exclude from Growth Chart --     Most recent vital signs: Vitals:   04/14/23 2058 04/15/23 0019  BP: (!) 163/92 (!) 169/86  Pulse: 75 78  Resp: 18 20  Temp: 98 F (36.7 C) 97.8 F (36.6 C)  SpO2: 98% 94%    General: Awake, alert, good spirits.  Obvious contusions to the left side of her forehead on the left side of her cheek.  Otherwise normal appearance of her face, no significant periorbital swelling, no evidence of eye injury, extraocular movement is intact, pupils are  equal. CV:  Good peripheral perfusion.  Resp:  Normal effort. Speaking easily and comfortably, no accessory muscle usage nor intercostal retractions.   Abd:  No distention.  Other:  Patient is able to actively range her arms fully with no evidence of limited range of motion or pain.  Patient reports no pain or tenderness to her legs.  No evidence of focal neurological deficit.   ED Results / Procedures / Treatments   Labs (all labs ordered are listed, but only abnormal results are displayed) Labs Reviewed - No data to display   RADIOLOGY I viewed and interpreted the patient's head CT and cervical spine CT as well as her lumbar spine x-rays.  I see no evidence of fracture, dislocation, intracranial bleeding, nor disruption of the patient's existing lumbar spine hardware.  Radiology reports agree with no evidence of acute findings.   PROCEDURES:  Critical Care performed: No  Procedures    IMPRESSION / MDM / ASSESSMENT AND PLAN /  ED COURSE  I reviewed the triage vital signs and the nursing notes.                              Differential diagnosis includes, but is not limited to, contusion, fracture, dislocation, intracranial bleeding.  Patient's presentation is most consistent with acute presentation with potential threat to life or bodily function.  Labs/studies ordered: CT head, CT cervical spine, lumbar spine x-rays  Interventions/Medications given:  Medications - No data to display  (Note:  hospital course my include additional interventions and/or labs/studies not listed above.)   Vital signs are normal other than hypertension.  Patient is well-appearing and is at her baseline according to her daughter who is at bedside.  No evidence of injury other than the contusions to her forehead and the left side of her face.  The patient is thrilled that she has no new injuries on her imaging and is more than happy and excited to go home.  She will follow-up with Dr. Rosita Kea.  I  gave my usual and customary return precaution.  She has tramadol that she takes at home.         FINAL CLINICAL IMPRESSION(S) / ED DIAGNOSES   Final diagnoses:  Fall, initial encounter  Minor head injury, initial encounter  Contusion of forehead, initial encounter  Facial contusion, initial encounter     Rx / DC Orders   ED Discharge Orders     None        Note:  This document was prepared using Dragon voice recognition software and may include unintentional dictation errors.   Loleta Rose, MD 04/15/23 [phone]

## 2023-04-15 NOTE — ED Notes (Signed)
Patient has some redness to the left side of face. Patient states she was walking from a carpeted area to an area with a rug and believes she tripped on the rug.

## 2023-04-15 NOTE — Discharge Instructions (Addendum)

## 2023-04-24 ENCOUNTER — Telehealth: Payer: Self-pay | Admitting: Orthopedic Surgery

## 2023-04-24 NOTE — Telephone Encounter (Signed)
Patient notified per Dr. Katrinka Blazing appointment has been changed.

## 2023-04-24 NOTE — Telephone Encounter (Signed)
Patient was seen in the ER for a fall on 8/17. Dr.Menz referred her here for neck and back pain. She had appt with Stacy on 05/08/2023. She is calling that she is having increased headaches. She is requesting to be seen sooner here for the headaches. Can I work her in with Dr.Smith Thursday?

## 2023-04-25 ENCOUNTER — Other Ambulatory Visit: Payer: Self-pay | Admitting: Family Medicine

## 2023-04-25 ENCOUNTER — Inpatient Hospital Stay
Admission: RE | Admit: 2023-04-25 | Discharge: 2023-04-25 | Disposition: A | Discharge status: Home / Self Care | Payer: Self-pay | Source: Ambulatory Visit | Attending: Neurosurgery | Admitting: Neurosurgery

## 2023-04-25 DIAGNOSIS — Z049 Encounter for examination and observation for unspecified reason: Secondary | ICD-10-CM

## 2023-04-25 NOTE — Progress Notes (Unsigned)
Referring Physician:  Kennedy Bucker, MD [address] [facility] [place],  [place] [address]  Primary Physician:  Lynnea Ferrier, MD  History of Present Illness: 04/25/2023 April Oliver is here today with a chief complaint of recent fall.  She had a polytrauma including a concussion and back injury.  Was seen in the emergency department and referred for closer follow-up.  She has a history of compression fracture.  Has a history of spinal deformity.  She feels like her headaches have been significant, she is felt like she was in a fog with some confusion.  Not having any new emotional changes.  Not having any new gross motor changes.   Conservative measures:  Physical therapy: no  Multimodal medical therapy including regular antiinflammatories: Tramadol, Meloxicam, Tylenol, Gabapentin  Injections: no epidural steroid injections  I have utilized the care everywhere function in epic to review the outside records available from external health systems.  Review of Systems:  A 10 point review of systems is negative, except for the pertinent positives and negatives detailed in the HPI.  Past Medical History: Past Medical History:  Diagnosis Date   Basal cell carcinoma    Chronic back pain    Chronic sciatica 03/11/2014   Gonalgia 06/10/2014   Hypertension    Hypothyroidism    Low back pain with sciatica 05/14/2014   MVP (mitral valve prolapse)    Scoliosis     Past Surgical History: Past Surgical History:  Procedure Laterality Date   APPENDECTOMY     BACK SURGERY     x 5   CATARACT EXTRACTION Bilateral    JOINT REPLACEMENT Bilateral    knee   NECK SURGERY     x 2   ROTATOR CUFF REPAIR Left     Allergies: Allergies as of 04/26/2023 - Review Complete 04/14/2023  Allergen Reaction Noted   Ambien [zolpidem] Other (See Comments) 01/17/2014   Morphine and codeine Other (See Comments) 01/17/2014    Medications:  Current Outpatient  Medications:    ADVAIR HFA 230-21 MCG/ACT inhaler, Inhale 2 puffs into the lungs 2 (two) times daily., Disp: , Rfl:    albuterol (VENTOLIN HFA) 108 (90 Base) MCG/ACT inhaler, INHALE 2 INHALATIONS BY MOUTH INTO THE LUNGS EVERY 6 HOURS AS NEEDED FOR WHEEZING, Disp: , Rfl:    albuterol-ipratropium (COMBIVENT) 18-103 MCG/ACT inhaler, 18-103 mcg/Actuation (Patient not taking: Reported on 11/08/2021), Disp: , Rfl:    benzonatate (TESSALON) 200 MG capsule, TAKE 1 CAPSULE BY MOUTH 3 TIMES A DAY AS NEEDED FOR COUGH FOR UP TO 7DAYS., Disp: , Rfl:    budesonide-formoterol (SYMBICORT) 160-4.5 MCG/ACT inhaler, Inhale into the lungs. (Patient not taking: Reported on 11/08/2021), Disp: , Rfl:    cetirizine (ZYRTEC ALLERGY) 10 MG tablet, 1 tablet, Disp: , Rfl:    Cholecalciferol (VITAMIN D3) 25 MCG (1000 UT) CAPS, Take by mouth., Disp: , Rfl:    ciclopirox (LOPROX) 0.77 % cream, , Disp: , Rfl:    furosemide (LASIX) 40 MG tablet, , Disp: , Rfl:    gabapentin (NEURONTIN) 300 MG capsule, 800 mg., Disp: , Rfl:    gabapentin (NEURONTIN) 800 MG tablet, , Disp: , Rfl:    levothyroxine (SYNTHROID, LEVOTHROID) 75 MCG tablet, , Disp: , Rfl:    Melatonin 3 MG TABS, Take by mouth., Disp: , Rfl:    meloxicam (MOBIC) 15 MG tablet, Take 1 tablet (15 mg total) by mouth daily. (Patient not taking: Reported on 11/08/2021), Disp: 30  tablet, Rfl: 3   metoprolol tartrate (LOPRESSOR) 25 MG tablet, , Disp: , Rfl:    Multiple Vitamins-Minerals (CENTRUM SILVER PO), Take by mouth., Disp: , Rfl:    pantoprazole (PROTONIX) 40 MG tablet, TAKE 1 TABLET BY MOUTH TWICE A DAY BEFORE MEALS, Disp: , Rfl:    potassium chloride (K-DUR) 10 MEQ tablet, TAKE 1 TABLET BY MOUTH ONCE DAILY WITH LASIX, Disp: , Rfl:    QUEtiapine (SEROQUEL) 25 MG tablet, TAKE ONE AND ONE-HALF TABLET BY MOUTH ATBEDTIME FOR SLEEP AND ANXIETY, Disp: 45 tablet, Rfl: 1   tiZANidine (ZANAFLEX) 4 MG tablet, , Disp: , Rfl:    traMADol (ULTRAM) 50 MG tablet, TAKE 1 TO 2 TABLETS BY  MOUTH EVERY 8 HOURS AS NEEDED FOR PAIN, Disp: , Rfl:    traZODone (DESYREL) 100 MG tablet, TAKE 2 TABLETS BY MOUTH AT BEDTIME AS NEEDED FOR SLEEP, Disp: 20 tablet, Rfl: 0   triamcinolone ointment (KENALOG) 0.1 %, , Disp: , Rfl:    Zinc 50 MG TABS, Take by mouth., Disp: , Rfl:   Current Facility-Administered Medications:    triamcinolone acetonide (KENALOG) 10 MG/ML injection 10 mg, 10 mg, Other, Once, Regal, Kirstie Peri, DPM  Social History: Social History   Tobacco Use   Smoking status: Former    Current packs/day: 0.00    Types: Cigarettes    Quit date: 08/2018    Years since quitting: 4.6   Smokeless tobacco: Never  Substance Use Topics   Alcohol use: Yes    Alcohol/week: 7.0 standard drinks of alcohol    Types: 7 Glasses of wine per week    Comment: glass of wine daily    Family Medical History: Family History  Problem Relation Age of Onset   Mental illness Neg Hx     Physical Examination: There were no vitals filed for this visit.  General: Patient is in no apparent distress. Attention to examination is appropriate.  Neck:   Supple.  Full range of motion.  Respiratory: Patient is breathing without any difficulty.   NEUROLOGICAL:     Awake, alert, oriented to person, place, and time.  Speech is clear and fluent.   Cranial Nerves: Pupils equal round and reactive to light.  Facial tone is symmetric.  Facial sensation is symmetric. Shoulder shrug is symmetric. Tongue protrusion is midline.    Strength: No major motor deficits noted on physical examination.  She does have some pain to movements and some postural deformity that somewhat limits some of her passive/active range of motion.  However overall her muscle activation feels symmetric.  Reflexes are without any pathologic findings including no Hoffmann's or clonus  No major sensory deficits noted on physical examination.  Imaging: Narrative & Impression  CLINICAL DATA:  Back pain   EXAM: LUMBAR SPINE -  COMPLETE 4+ VIEW   COMPARISON:  MRI 01/06/2016, 12/06/2019 report   FINDINGS: Levoscoliosis of the thoracolumbar spine. Fusion at L3-L4 and ankylosis of L4-L5. Severe disc space narrowing and irregular endplate change L1-L2 and L2-L3. Vertebral body heights are maintained.   IMPRESSION: 1. Levoscoliosis with severe degenerative change at L1-L2 and L2-L3. 2. Fusion at fusion of the L3 through L5 vertebral bodies. No definite acute osseous abnormality     Electronically Signed   By: Jasmine Pang M.D.   On: 04/14/2023 21:45     Narrative & Impression  CLINICAL DATA:  Head trauma after tripping on rug. Abrasion to left forehead and left cheek.   EXAM: CT HEAD WITHOUT CONTRAST  CT CERVICAL SPINE WITHOUT CONTRAST   TECHNIQUE: Multidetector CT imaging of the head and cervical spine was performed following the standard protocol without intravenous contrast. Multiplanar CT image reconstructions of the cervical spine were also generated.   RADIATION DOSE REDUCTION: This exam was performed according to the departmental dose-optimization program which includes automated exposure control, adjustment of the mA and/or kV according to patient size and/or use of iterative reconstruction technique.   COMPARISON:  Cervical spine radiographs 05/26/2014   FINDINGS: CT HEAD FINDINGS   Brain: No intracranial hemorrhage, mass effect, or evidence of acute infarct. No hydrocephalus. No extra-axial fluid collection. Generalized cerebral atrophy. Ill-defined hypoattenuation within the cerebral white matter is nonspecific but consistent with chronic small vessel ischemic disease.   Vascular: No hyperdense vessel. Intracranial arterial calcification.   Skull: No fracture or focal lesion.   Sinuses/Orbits: No acute finding.   Other: None.   CT CERVICAL SPINE FINDINGS   Alignment: No evidence of traumatic malalignment.   Skull base and vertebrae: Demineralization. No acute fracture.  No primary bone lesion or focal pathologic process.   Soft tissues and spinal canal: No prevertebral fluid or swelling. No visible canal hematoma.   Disc levels: ACDF C5-C6. Multilevel spondylosis, disc space height loss, and degenerative endplate changes greatest at C6-C7 and C7-T1 where it is advanced. Advanced bilateral facet arthropathy from C2-C5 greater on the left. No severe spinal canal narrowing.   Upper chest: No acute abnormality.   Other: Carotid calcification.   IMPRESSION: 1. No acute intracranial abnormality. Generalized atrophy and small vessel white matter disease. 2. No acute fracture in the cervical spine. Multilevel degenerative spondylosis.     Electronically Signed   By: Minerva Fester M.D.   On: 04/14/2023 21:39     Narrative & Impression  CLINICAL DATA:  Head trauma after tripping on rug. Abrasion to left forehead and left cheek.   EXAM: CT HEAD WITHOUT CONTRAST   CT CERVICAL SPINE WITHOUT CONTRAST   TECHNIQUE: Multidetector CT imaging of the head and cervical spine was performed following the standard protocol without intravenous contrast. Multiplanar CT image reconstructions of the cervical spine were also generated.   RADIATION DOSE REDUCTION: This exam was performed according to the departmental dose-optimization program which includes automated exposure control, adjustment of the mA and/or kV according to patient size and/or use of iterative reconstruction technique.   COMPARISON:  Cervical spine radiographs 05/26/2014   FINDINGS: CT HEAD FINDINGS   Brain: No intracranial hemorrhage, mass effect, or evidence of acute infarct. No hydrocephalus. No extra-axial fluid collection. Generalized cerebral atrophy. Ill-defined hypoattenuation within the cerebral white matter is nonspecific but consistent with chronic small vessel ischemic disease.   Vascular: No hyperdense vessel. Intracranial arterial calcification.   Skull: No fracture  or focal lesion.   Sinuses/Orbits: No acute finding.   Other: None.   CT CERVICAL SPINE FINDINGS   Alignment: No evidence of traumatic malalignment.   Skull base and vertebrae: Demineralization. No acute fracture. No primary bone lesion or focal pathologic process.   Soft tissues and spinal canal: No prevertebral fluid or swelling. No visible canal hematoma.   Disc levels: ACDF C5-C6. Multilevel spondylosis, disc space height loss, and degenerative endplate changes greatest at C6-C7 and C7-T1 where it is advanced. Advanced bilateral facet arthropathy from C2-C5 greater on the left. No severe spinal canal narrowing.   Upper chest: No acute abnormality.   Other: Carotid calcification.   IMPRESSION: 1. No acute intracranial abnormality. Generalized atrophy and small vessel white  matter disease. 2. No acute fracture in the cervical spine. Multilevel degenerative spondylosis.     Electronically Signed   By: Minerva Fester M.D.   On: 04/14/2023 21:39    I have personally reviewed the images and agree with the above interpretation.  Medical Decision Making/Assessment and Plan: April Oliver is a pleasant 89 y.o. female with follow-up from her recent trauma.  She had a concussion as well as question of whether or not she may have had a compression fracture.  At this point she does have continued diffuse pain.  She also has continued headaches.  She feels that she is having some confusion.  Overall though she does feel like she is slowly improving.  We discussed her spine and its deformity, should she want to seek care for the curve and her back pain she would likely need to see a deformity specialist, however she states that she would not be interested in any spine surgery.  In regards to the head trauma there is no evidence of intracranial hemorrhage on her CT scan, she did have loss of consciousness likely suffered a concussion/traumatic brain injury.  We discussed the long-term  sequela of these including sleep difficulties, confusion, fatigue, emotional lability, among others, that if she continue to worsen we could refer for her to a TBI specialist from a rehab standpoint/neurology standpoint.  At this point she does not require any neurosurgical intervention.   Thank you for involving me in the care of this patient.    Lovenia Kim MD/MSCR Neurosurgery

## 2023-04-26 ENCOUNTER — Ambulatory Visit: Payer: Medicare HMO | Admitting: Neurosurgery

## 2023-04-26 VITALS — BP 130/72 | Ht 63.0 in | Wt 180.0 lb

## 2023-04-26 DIAGNOSIS — S060X9D Concussion with loss of consciousness of unspecified duration, subsequent encounter: Secondary | ICD-10-CM

## 2023-04-26 DIAGNOSIS — M48061 Spinal stenosis, lumbar region without neurogenic claudication: Secondary | ICD-10-CM

## 2023-04-26 DIAGNOSIS — Q7649 Other congenital malformations of spine, not associated with scoliosis: Secondary | ICD-10-CM

## 2023-04-26 DIAGNOSIS — M5416 Radiculopathy, lumbar region: Secondary | ICD-10-CM

## 2023-04-26 DIAGNOSIS — S060X9A Concussion with loss of consciousness of unspecified duration, initial encounter: Secondary | ICD-10-CM | POA: Diagnosis not present

## 2023-05-08 ENCOUNTER — Ambulatory Visit: Payer: Medicare HMO | Admitting: Orthopedic Surgery

## 2023-06-30 ENCOUNTER — Other Ambulatory Visit: Payer: Self-pay | Admitting: Internal Medicine

## 2023-06-30 DIAGNOSIS — M4807 Spinal stenosis, lumbosacral region: Secondary | ICD-10-CM

## 2023-07-14 ENCOUNTER — Ambulatory Visit
Admission: RE | Admit: 2023-07-14 | Discharge: 2023-07-14 | Disposition: A | Discharge status: Home / Self Care | Payer: Medicare HMO | Source: Ambulatory Visit | Attending: Internal Medicine | Admitting: Internal Medicine

## 2023-07-14 DIAGNOSIS — M4807 Spinal stenosis, lumbosacral region: Secondary | ICD-10-CM | POA: Insufficient documentation

## 2023-08-18 ENCOUNTER — Other Ambulatory Visit: Payer: Self-pay | Admitting: Internal Medicine

## 2023-08-18 DIAGNOSIS — R42 Dizziness and giddiness: Secondary | ICD-10-CM

## 2023-08-18 DIAGNOSIS — J329 Chronic sinusitis, unspecified: Secondary | ICD-10-CM

## 2023-10-04 ENCOUNTER — Ambulatory Visit
Admission: RE | Admit: 2023-10-04 | Discharge: 2023-10-04 | Disposition: A | Discharge status: Home / Self Care | Payer: Medicare HMO | Source: Ambulatory Visit | Attending: Internal Medicine | Admitting: Internal Medicine

## 2023-10-04 DIAGNOSIS — J329 Chronic sinusitis, unspecified: Secondary | ICD-10-CM | POA: Diagnosis present

## 2023-10-04 DIAGNOSIS — R42 Dizziness and giddiness: Secondary | ICD-10-CM | POA: Insufficient documentation

## 2023-10-13 ENCOUNTER — Other Ambulatory Visit: Payer: Self-pay | Admitting: Pulmonary Disease

## 2023-10-13 DIAGNOSIS — M79604 Pain in right leg: Secondary | ICD-10-CM

## 2023-10-13 DIAGNOSIS — R600 Localized edema: Secondary | ICD-10-CM

## 2023-11-13 ENCOUNTER — Other Ambulatory Visit: Payer: Self-pay | Admitting: Emergency Medicine

## 2023-11-13 DIAGNOSIS — R0602 Shortness of breath: Secondary | ICD-10-CM

## 2023-11-13 DIAGNOSIS — R911 Solitary pulmonary nodule: Secondary | ICD-10-CM

## 2023-11-13 DIAGNOSIS — J4489 Other specified chronic obstructive pulmonary disease: Secondary | ICD-10-CM

## 2023-11-20 ENCOUNTER — Ambulatory Visit
Admission: RE | Admit: 2023-11-20 | Discharge: 2023-11-20 | Disposition: A | Discharge status: Home / Self Care | Source: Ambulatory Visit | Attending: Emergency Medicine | Admitting: Emergency Medicine

## 2023-11-20 DIAGNOSIS — J4489 Other specified chronic obstructive pulmonary disease: Secondary | ICD-10-CM | POA: Insufficient documentation

## 2023-11-20 DIAGNOSIS — R0602 Shortness of breath: Secondary | ICD-10-CM | POA: Diagnosis present

## 2023-11-20 DIAGNOSIS — R911 Solitary pulmonary nodule: Secondary | ICD-10-CM | POA: Diagnosis present

## 2024-07-22 NOTE — Therapy (Incomplete)
 OUTPATIENT PHYSICAL THERAPY THORACOLUMBAR EVALUATION   Patient Name: April Oliver MRN: [id number] DOB:Mar 14, 1934, 89 y.o., female Today's Date: 07/22/2024  END OF SESSION:   Past Medical History:  Diagnosis Date   Basal cell carcinoma    Chronic back pain    Chronic sciatica 03/11/2014   Gonalgia 06/10/2014   Hypertension    Hypothyroidism    Low back pain with sciatica 05/14/2014   MVP (mitral valve prolapse)    Scoliosis    Past Surgical History:  Procedure Laterality Date   APPENDECTOMY     BACK SURGERY     x 5   CATARACT EXTRACTION Bilateral    JOINT REPLACEMENT Bilateral    knee   NECK SURGERY     x 2   ROTATOR CUFF REPAIR Left    Patient Active Problem List   Diagnosis Date Noted   Lymphedema 11/08/2021   Chronic venous insufficiency 11/08/2021   GAD (generalized anxiety disorder) 03/07/2019   Insomnia due to medical condition 02/14/2019   Benzodiazepine dependence (HCC) 02/14/2019   CKD (chronic kidney disease) stage 3, GFR 30-59 ml/min (HCC) 07/12/2017   Primary osteoarthritis of right knee 04/17/2017   Osseous and subluxation stenosis of intervertebral foramina of lumbar region 01/17/2016   Spinal stenosis of lumbar region with radiculopathy 01/17/2016   Chronic bilateral knee pain 07/20/2015   Chronic low back pain 07/20/2015   Lumbar spondylosis 07/20/2015   Long term current use of opiate analgesic 07/20/2015   Long term prescription opiate use 07/20/2015   Opiate use 07/20/2015   Opiate dependence (HCC) 07/20/2015   Encounter for therapeutic drug level monitoring 07/20/2015   Encounter for chronic pain management 07/20/2015   Lumbar facet syndrome 07/20/2015   Chronic lumbar radicular pain 07/20/2015   Essential hypertension 07/03/2015   Plantar fasciitis 04/16/2015   Tendonitis 04/16/2015   Chronic pain associated with significant psychosocial dysfunction 03/30/2015   Clinical depression 03/21/2015   OP (osteoporosis) 03/21/2015   RAD  (reactive airway disease) 10/27/2014   Osteopenia 09/22/2014   History of artificial joint 08/05/2014   False positive serological test for syphilis 07/31/2014   Chronic back pain 06/23/2014   Instability of knee joint 06/10/2014   History of bilateral knee replacements 06/10/2014   Complication of joint prosthesis 03/17/2014   Anxiety 03/11/2014   BP (high blood pressure) 03/11/2014   Adult hypothyroidism 03/11/2014    PCP: Fernande Ophelia JINNY DOUGLAS, MD   REFERRING PROVIDER: Fernande Ophelia JINNY DOUGLAS, MD  REFERRING DIAG:  [phone] (ICD-10-CM) - Spinal stenosis, lumbar region without neurogenic claudication M54.16 (ICD-10-CM) - Radiculopathy, lumbar region M54.31 (ICD-10-CM) - Sciatica, right side R26.0 (ICD-10-CM) - Ataxic gait  Rationale for Evaluation and Treatment: Rehabilitation  THERAPY DIAG:  No diagnosis found.  ONSET DATE: chronic  SUBJECTIVE:  SUBJECTIVE STATEMENT: ***  PERTINENT HISTORY:  89 y/o female with chronic history of low back and L side pain with known L hip arthritis. She reports to ortho MD that pain originates from her knees and radiating into her L hip. No numbness associated with the pain but significant tenderness over L hip making it difficult to lay comfortably. Per ortho MD, patient has L trochanteric bursitis.   PAIN:  Are you having pain? Yes: NPRS scale: *** Pain location: *** Pain description: *** Aggravating factors: *** Relieving factors: ***  PRECAUTIONS: Fall  RED FLAGS: {PT Red Flags:29287}   WEIGHT BEARING RESTRICTIONS: No  FALLS:  Has patient fallen in last 6 months? {fallsyesno:27318}  LIVING ENVIRONMENT: Lives with: {OPRC lives with:25569::lives with their family} Lives in: {Lives in:25570} Stairs: {opstairs:27293} Has following equipment at home:  {Assistive devices:23999}  OCCUPATION: ***  PLOF: {PLOF:24004}  PATIENT GOALS: ***   OBJECTIVE:  Note: Objective measures were completed at Evaluation unless otherwise noted.  DIAGNOSTIC FINDINGS:  N/A   PATIENT SURVEYS:  {rehab surveys:24030}  COGNITION: Overall cognitive status: Within functional limits for tasks assessed     SENSATION: {sensation:27233}  MUSCLE LENGTH: Hamstrings: Right *** deg; Left *** deg Debby test: Right *** deg; Left *** deg  POSTURE: {posture:25561}  PALPATION: ***  LUMBAR ROM:   AROM eval  Flexion   Extension   Right lateral flexion   Left lateral flexion   Right rotation   Left rotation    (Blank rows = not tested)  LOWER EXTREMITY ROM:     {AROM/PROM:27142}  Right eval Left eval  Hip flexion    Hip extension    Hip abduction    Hip adduction    Hip internal rotation    Hip external rotation    Knee flexion    Knee extension    Ankle dorsiflexion    Ankle plantarflexion    Ankle inversion    Ankle eversion     (Blank rows = not tested)  LOWER EXTREMITY MMT:    MMT Right eval Left eval  Hip flexion    Hip extension    Hip abduction    Hip adduction    Hip internal rotation    Hip external rotation    Knee flexion    Knee extension    Ankle dorsiflexion    Ankle plantarflexion    Ankle inversion    Ankle eversion     (Blank rows = not tested)  LUMBAR SPECIAL TESTS:  {lumbar special test:25242}  FUNCTIONAL TESTS:  30 seconds chair stand test: *** 6 minute walk test: ***  GAIT: Distance walked: *** Assistive device utilized: {Assistive devices:23999} Level of assistance: {Levels of assistance:24026} Comments: ***  TREATMENT DATE: 07/22/24                                                                                                                                 PATIENT EDUCATION:  Education details:  HEP, POC, goals Person educated: Patient Education method: Explanation, Demonstration,  and Handouts Education comprehension: verbalized understanding and returned demonstration  HOME EXERCISE PROGRAM: ***  ASSESSMENT:  CLINICAL IMPRESSION: Patient is a 89 y.o. female who was seen today for physical therapy evaluation and treatment for ***.   OBJECTIVE IMPAIRMENTS: {opptimpairments:25111}.   ACTIVITY LIMITATIONS: {activitylimitations:27494}  PARTICIPATION LIMITATIONS: {participationrestrictions:25113}  PERSONAL FACTORS: {Personal factors:25162} are also affecting patient's functional outcome.   REHAB POTENTIAL: Fair    CLINICAL DECISION MAKING: Evolving/moderate complexity  EVALUATION COMPLEXITY: Moderate   GOALS: Goals reviewed with patient? Yes  SHORT TERM GOALS: Target date: 08/19/2024  Patient will be independent in HEP to improve strength/mobility for better functional independence with ADLs.  Baseline: Goal status: INITIAL   LONG TERM GOALS: Target date: 09/17/2024  *** Baseline:  Goal status: INITIAL  2.  Patient will improve 30 second chair stand test to >/= 11 reps to demonstrate improvement in functional BLE strength.  Baseline: 07/23/24: ***  Goal status: INITIAL  3.  Patient will report a worst pain of 3/10 on NRPS in             to improve tolerance with ADLs and reduced symptoms with activities.  Baseline: 07/23/24: ***  Goal status: INITIAL  4.  *** Baseline:  Goal status: INITIAL  5.  *** Baseline:  Goal status: INITIAL  6.  *** Baseline:  Goal status: INITIAL  PLAN:  PT FREQUENCY: 1-2x/week  PT DURATION: 8 weeks  PLANNED INTERVENTIONS: 97164- PT Re-evaluation, 97750- Physical Performance Testing, 97110-Therapeutic exercises, 97530- Therapeutic activity, [id number]- Neuromuscular re-education, 97535- Self Care, 02859- Manual therapy, [id number]- Gait training, [id number]- Electrical stimulation (unattended), [id number]- Electrical stimulation (manual), 20560 (1-2 muscles), 20561 (3+ muscles)- Dry Needling, Patient/Family education, Balance  training, Joint mobilization, Joint manipulation, Spinal manipulation, Spinal mobilization, Cryotherapy, and Moist heat.  PLAN FOR NEXT SESSION: ***   Maryanne Finder, PT, DPT Physical Therapist - [facility] 07/22/2024, 4:12 PM

## 2024-07-23 ENCOUNTER — Ambulatory Visit: Admitting: Physical Therapy

## 2024-07-29 ENCOUNTER — Ambulatory Visit: Admitting: Physical Therapy

## 2024-08-05 ENCOUNTER — Ambulatory Visit: Admitting: Physical Therapy

## 2024-08-07 ENCOUNTER — Ambulatory Visit: Admitting: Physical Therapy

## 2024-08-12 ENCOUNTER — Ambulatory Visit: Admitting: Physical Therapy

## 2024-08-14 ENCOUNTER — Ambulatory Visit: Admitting: Physical Therapy

## 2024-08-26 ENCOUNTER — Ambulatory Visit: Admitting: Physical Therapy

## 2024-08-28 ENCOUNTER — Ambulatory Visit: Admitting: Physical Therapy

## 2024-09-03 ENCOUNTER — Ambulatory Visit: Admitting: Physical Therapy

## 2024-09-09 ENCOUNTER — Ambulatory Visit: Admitting: Physical Therapy

## 2024-09-11 ENCOUNTER — Ambulatory Visit: Admitting: Physical Therapy

## 2024-09-16 ENCOUNTER — Ambulatory Visit: Admitting: Physical Therapy

## 2024-09-18 ENCOUNTER — Ambulatory Visit: Admitting: Physical Therapy

## 2024-09-23 ENCOUNTER — Ambulatory Visit: Admitting: Physical Therapy

## 2024-09-25 ENCOUNTER — Ambulatory Visit: Admitting: Physical Therapy
